# Patient Record
Sex: Female | Born: 1955
Health system: Southern US, Community
[De-identification: ages and names within clinical notes are randomized; demographics above are authoritative.]

## PROBLEM LIST (undated history)

## (undated) DIAGNOSIS — E785 Hyperlipidemia, unspecified: Secondary | ICD-10-CM

## (undated) DIAGNOSIS — E119 Type 2 diabetes mellitus without complications: Secondary | ICD-10-CM

## (undated) DIAGNOSIS — I10 Essential (primary) hypertension: Secondary | ICD-10-CM

## (undated) HISTORY — PX: BREAST SURGERY: SHX581

## (undated) HISTORY — PX: REDUCTION MAMMAPLASTY: SUR839

---

## 1998-08-26 ENCOUNTER — Other Ambulatory Visit: Admission: RE | Admit: 1998-08-26 | Discharge: 1998-08-26 | Payer: Self-pay | Admitting: Obstetrics and Gynecology

## 1998-09-02 ENCOUNTER — Other Ambulatory Visit: Admission: RE | Admit: 1998-09-02 | Discharge: 1998-09-02 | Payer: Self-pay | Admitting: Otolaryngology

## 2000-04-29 ENCOUNTER — Other Ambulatory Visit: Admission: RE | Admit: 2000-04-29 | Discharge: 2000-04-29 | Payer: Self-pay | Admitting: Obstetrics and Gynecology

## 2001-04-21 ENCOUNTER — Encounter: Admission: RE | Admit: 2001-04-21 | Discharge: 2001-04-21 | Payer: Self-pay | Admitting: Obstetrics and Gynecology

## 2001-04-21 ENCOUNTER — Encounter: Payer: Self-pay | Admitting: Obstetrics and Gynecology

## 2001-09-28 ENCOUNTER — Other Ambulatory Visit: Admission: RE | Admit: 2001-09-28 | Discharge: 2001-09-28 | Payer: Self-pay | Admitting: Obstetrics and Gynecology

## 2003-03-19 ENCOUNTER — Encounter: Admission: RE | Admit: 2003-03-19 | Discharge: 2003-03-19 | Payer: Self-pay | Admitting: Obstetrics and Gynecology

## 2003-04-23 ENCOUNTER — Other Ambulatory Visit: Admission: RE | Admit: 2003-04-23 | Discharge: 2003-04-23 | Payer: Self-pay | Admitting: Obstetrics and Gynecology

## 2003-06-05 ENCOUNTER — Encounter (INDEPENDENT_AMBULATORY_CARE_PROVIDER_SITE_OTHER): Payer: Self-pay | Admitting: Specialist

## 2003-06-05 ENCOUNTER — Ambulatory Visit (HOSPITAL_COMMUNITY): Admission: RE | Admit: 2003-06-05 | Discharge: 2003-06-05 | Payer: Self-pay | Admitting: Obstetrics and Gynecology

## 2004-02-10 HISTORY — PX: REDUCTION MAMMAPLASTY: SUR839

## 2004-05-16 ENCOUNTER — Encounter: Admission: RE | Admit: 2004-05-16 | Discharge: 2004-05-16 | Payer: Self-pay | Admitting: Obstetrics and Gynecology

## 2004-06-26 ENCOUNTER — Encounter (INDEPENDENT_AMBULATORY_CARE_PROVIDER_SITE_OTHER): Payer: Self-pay | Admitting: Specialist

## 2004-06-26 ENCOUNTER — Ambulatory Visit (HOSPITAL_COMMUNITY): Admission: RE | Admit: 2004-06-26 | Discharge: 2004-06-26 | Payer: Self-pay | Admitting: Gastroenterology

## 2004-07-22 ENCOUNTER — Other Ambulatory Visit: Admission: RE | Admit: 2004-07-22 | Discharge: 2004-07-22 | Payer: Self-pay | Admitting: Obstetrics and Gynecology

## 2006-03-30 ENCOUNTER — Encounter: Admission: RE | Admit: 2006-03-30 | Discharge: 2006-03-30 | Payer: Self-pay | Admitting: Obstetrics and Gynecology

## 2008-01-30 ENCOUNTER — Encounter: Admission: RE | Admit: 2008-01-30 | Discharge: 2008-01-30 | Payer: Self-pay | Admitting: Family Medicine

## 2008-02-20 ENCOUNTER — Encounter: Admission: RE | Admit: 2008-02-20 | Discharge: 2008-02-20 | Payer: Self-pay | Admitting: Family Medicine

## 2008-05-02 ENCOUNTER — Encounter: Admission: RE | Admit: 2008-05-02 | Discharge: 2008-05-02 | Payer: Self-pay | Admitting: Family Medicine

## 2009-05-21 ENCOUNTER — Encounter: Admission: RE | Admit: 2009-05-21 | Discharge: 2009-05-21 | Payer: Self-pay | Admitting: Family Medicine

## 2010-02-20 ENCOUNTER — Other Ambulatory Visit
Admission: RE | Admit: 2010-02-20 | Discharge: 2010-02-20 | Payer: Self-pay | Source: Home / Self Care | Admitting: Family Medicine

## 2010-03-02 ENCOUNTER — Encounter: Payer: Self-pay | Admitting: Family Medicine

## 2010-03-02 ENCOUNTER — Encounter: Payer: Self-pay | Admitting: Obstetrics and Gynecology

## 2010-06-27 NOTE — Op Note (Signed)
NAMEAVERIANNA, Diane Sherman                          ACCOUNT NO.:  [id number]   MEDICAL RECORD NO.:  [id number]                   PATIENT TYPE:  AMB   LOCATION:  SDC                                  FACILITY:  [facility]   PHYSICIAN:  Juluis Mire, M.D.                DATE OF BIRTH:  19-May-1955   DATE OF PROCEDURE:  06/05/2003  DATE OF DISCHARGE:                                 OPERATIVE REPORT   PREOPERATIVE DIAGNOSES:  1. Anovulatory bleeding.  2. Endometrial polyp.   POSTOPERATIVE DIAGNOSES:  1. Anovulatory bleeding.  2. Endometrial polyp.   PROCEDURE:  Paracervical block.  Cervical dilation.  Hysteroscopy resection  of submucosal fibroid.  Multiple endometrial biopsies.   SURGEON:  Juluis Mire, M.D.   ANESTHESIA:  Paracervical block and sedation.   ESTIMATED BLOOD LOSS:  Minimal.   PACKS AND DRAINS:  None.   INTRAOPERATIVE BLOOD PLACED:  None.   COMPLICATIONS:  None.   INDICATIONS FOR PROCEDURE:  As noted in history and physical.   DESCRIPTION OF PROCEDURE:  The patient taken to the OR and placed in the  supine position.  After sedation, was placed in the dorsal lithotomy  position using Allen stirrups.  The patient was draped out for hysteroscopy.  Speculum was placed in the vaginal vault.  The cervix and vagina closed with  Betadine.  A paracervical block was instituted using 1% Nesacaine.  Cervix  grasped with single-tooth tenaculum.  Uterus sounded to 9 cm.  Cervix  serially dilated to a size 37 Pratt dilator.  Operative hysteroscope was  then introduced.  Intrauterine cavity was distended using Sorbitol.  Visualization revealed a submucosal fibroid on the right posterior aspect of  the uterus.  This was completely resected and sent for pathological review.  Multiple endometrial biopsies were obtained.  Endometrium was otherwise thin  and atrophic.  There was no other evidence of abnormalities.  She had no  active bleeding or signs of perforation.  At this point in  time, the  hysteroscope was removed.  Single-tooth tenaculum and  speculum were then removed.  The patient taken out of the dorsal lithotomy  position and once alert, transferred to the recovery room in good condition.  Sponge, needle and instrument counts were reported as correct by the  circulating nurse.                                               Juluis Mire, M.D.    JSM/MEDQ  D:  06/05/2003  T:  06/05/2003  Job:  [id number]

## 2011-09-21 ENCOUNTER — Other Ambulatory Visit: Payer: Self-pay | Admitting: Family Medicine

## 2011-09-21 DIAGNOSIS — Z1231 Encounter for screening mammogram for malignant neoplasm of breast: Secondary | ICD-10-CM

## 2011-10-15 ENCOUNTER — Ambulatory Visit
Admission: RE | Admit: 2011-10-15 | Discharge: 2011-10-15 | Disposition: A | Discharge status: Home / Self Care | Payer: 59 | Source: Ambulatory Visit | Attending: Family Medicine | Admitting: Family Medicine

## 2011-10-15 ENCOUNTER — Ambulatory Visit: Payer: Self-pay

## 2011-10-15 DIAGNOSIS — Z1231 Encounter for screening mammogram for malignant neoplasm of breast: Secondary | ICD-10-CM

## 2013-05-04 ENCOUNTER — Other Ambulatory Visit: Payer: Self-pay

## 2013-05-04 ENCOUNTER — Other Ambulatory Visit (HOSPITAL_COMMUNITY)
Admission: RE | Admit: 2013-05-04 | Discharge: 2013-05-04 | Disposition: A | Discharge status: Home / Self Care | Payer: 59 | Source: Ambulatory Visit | Attending: Family Medicine | Admitting: Family Medicine

## 2013-05-04 ENCOUNTER — Other Ambulatory Visit: Payer: Self-pay | Admitting: Family Medicine

## 2013-05-04 DIAGNOSIS — Z0000 Encounter for general adult medical examination without abnormal findings: Secondary | ICD-10-CM | POA: Insufficient documentation

## 2013-05-04 DIAGNOSIS — Z1231 Encounter for screening mammogram for malignant neoplasm of breast: Secondary | ICD-10-CM

## 2013-05-04 DIAGNOSIS — Z9889 Other specified postprocedural states: Secondary | ICD-10-CM

## 2013-05-12 ENCOUNTER — Ambulatory Visit: Payer: 59

## 2013-05-15 ENCOUNTER — Ambulatory Visit
Admission: RE | Admit: 2013-05-15 | Discharge: 2013-05-15 | Disposition: A | Discharge status: Home / Self Care | Payer: 59 | Source: Ambulatory Visit

## 2013-05-15 DIAGNOSIS — Z1231 Encounter for screening mammogram for malignant neoplasm of breast: Secondary | ICD-10-CM

## 2013-05-15 DIAGNOSIS — Z9889 Other specified postprocedural states: Secondary | ICD-10-CM

## 2015-06-23 DIAGNOSIS — J019 Acute sinusitis, unspecified: Secondary | ICD-10-CM | POA: Diagnosis not present

## 2015-07-15 DIAGNOSIS — K219 Gastro-esophageal reflux disease without esophagitis: Secondary | ICD-10-CM | POA: Diagnosis not present

## 2015-07-15 DIAGNOSIS — E119 Type 2 diabetes mellitus without complications: Secondary | ICD-10-CM | POA: Diagnosis not present

## 2015-07-15 DIAGNOSIS — K760 Fatty (change of) liver, not elsewhere classified: Secondary | ICD-10-CM | POA: Diagnosis not present

## 2015-07-15 DIAGNOSIS — E1165 Type 2 diabetes mellitus with hyperglycemia: Secondary | ICD-10-CM | POA: Diagnosis not present

## 2015-07-15 DIAGNOSIS — I10 Essential (primary) hypertension: Secondary | ICD-10-CM | POA: Diagnosis not present

## 2015-07-15 DIAGNOSIS — Z79899 Other long term (current) drug therapy: Secondary | ICD-10-CM | POA: Diagnosis not present

## 2015-07-15 DIAGNOSIS — E785 Hyperlipidemia, unspecified: Secondary | ICD-10-CM | POA: Diagnosis not present

## 2015-08-22 DIAGNOSIS — R0781 Pleurodynia: Secondary | ICD-10-CM | POA: Diagnosis not present

## 2015-08-22 DIAGNOSIS — R109 Unspecified abdominal pain: Secondary | ICD-10-CM | POA: Diagnosis not present

## 2015-08-22 DIAGNOSIS — M79605 Pain in left leg: Secondary | ICD-10-CM | POA: Diagnosis not present

## 2015-09-20 DIAGNOSIS — E119 Type 2 diabetes mellitus without complications: Secondary | ICD-10-CM | POA: Diagnosis not present

## 2015-09-20 DIAGNOSIS — Z79891 Long term (current) use of opiate analgesic: Secondary | ICD-10-CM | POA: Diagnosis not present

## 2015-09-20 DIAGNOSIS — Z79899 Other long term (current) drug therapy: Secondary | ICD-10-CM | POA: Diagnosis not present

## 2015-09-20 DIAGNOSIS — Z7982 Long term (current) use of aspirin: Secondary | ICD-10-CM | POA: Diagnosis not present

## 2015-09-20 DIAGNOSIS — G4730 Sleep apnea, unspecified: Secondary | ICD-10-CM | POA: Diagnosis not present

## 2015-09-20 DIAGNOSIS — Z86010 Personal history of colonic polyps: Secondary | ICD-10-CM | POA: Diagnosis not present

## 2015-09-20 DIAGNOSIS — Z7984 Long term (current) use of oral hypoglycemic drugs: Secondary | ICD-10-CM | POA: Diagnosis not present

## 2015-09-20 DIAGNOSIS — Z8719 Personal history of other diseases of the digestive system: Secondary | ICD-10-CM | POA: Diagnosis not present

## 2015-09-20 DIAGNOSIS — Z1211 Encounter for screening for malignant neoplasm of colon: Secondary | ICD-10-CM | POA: Diagnosis not present

## 2015-09-20 DIAGNOSIS — D120 Benign neoplasm of cecum: Secondary | ICD-10-CM | POA: Diagnosis not present

## 2015-09-20 DIAGNOSIS — I10 Essential (primary) hypertension: Secondary | ICD-10-CM | POA: Diagnosis not present

## 2015-09-20 DIAGNOSIS — Z7951 Long term (current) use of inhaled steroids: Secondary | ICD-10-CM | POA: Diagnosis not present

## 2015-11-18 DIAGNOSIS — E785 Hyperlipidemia, unspecified: Secondary | ICD-10-CM | POA: Diagnosis not present

## 2015-11-18 DIAGNOSIS — Z23 Encounter for immunization: Secondary | ICD-10-CM | POA: Diagnosis not present

## 2015-11-18 DIAGNOSIS — E119 Type 2 diabetes mellitus without complications: Secondary | ICD-10-CM | POA: Diagnosis not present

## 2015-11-18 DIAGNOSIS — I10 Essential (primary) hypertension: Secondary | ICD-10-CM | POA: Diagnosis not present

## 2016-02-18 DIAGNOSIS — E785 Hyperlipidemia, unspecified: Secondary | ICD-10-CM | POA: Diagnosis not present

## 2016-02-18 DIAGNOSIS — I10 Essential (primary) hypertension: Secondary | ICD-10-CM | POA: Diagnosis not present

## 2016-02-18 DIAGNOSIS — E1165 Type 2 diabetes mellitus with hyperglycemia: Secondary | ICD-10-CM | POA: Diagnosis not present

## 2016-02-18 DIAGNOSIS — E119 Type 2 diabetes mellitus without complications: Secondary | ICD-10-CM | POA: Diagnosis not present

## 2016-03-04 DIAGNOSIS — H524 Presbyopia: Secondary | ICD-10-CM | POA: Diagnosis not present

## 2016-03-04 DIAGNOSIS — E119 Type 2 diabetes mellitus without complications: Secondary | ICD-10-CM | POA: Diagnosis not present

## 2016-04-07 ENCOUNTER — Encounter: Payer: BLUE CROSS/BLUE SHIELD | Attending: Family Medicine | Admitting: Registered"

## 2016-04-07 DIAGNOSIS — E119 Type 2 diabetes mellitus without complications: Secondary | ICD-10-CM

## 2016-04-07 DIAGNOSIS — Z713 Dietary counseling and surveillance: Secondary | ICD-10-CM | POA: Diagnosis not present

## 2016-04-07 NOTE — Patient Instructions (Signed)
Plan:  Aim for 2-3 Carb Choices per meal (30-45 grams)   Aim for 0-2 Carbs per snack if hungry  Consider veggies and yogurt ranch dip for another snack options Include protein in moderation with your meals and snacks Consider reading food labels for Total Carbohydrate and Sat Fat Grams of foods Consider increasing your activity level by 10-15 minutes daily and work up to 30 min with weekend Zumba as tolerated Continue checking BG at alternate times per day as directed by MD  Consider taking medication as directed by MD

## 2016-04-07 NOTE — Progress Notes (Signed)
Diabetes Self-Management Education  Visit Type: First/Initial  Appt. Start Time: 1600 Appt. End Time: 1715  04/07/2016  Ms. Diane Sherman, identified by name and date of birth, is a 61 y.o. female with a diagnosis of Diabetes: Type 2.   ASSESSMENT      Diabetes Self-Management Education - 04/07/16 1609      Visit Information   Visit Type First/Initial     Initial Visit   Diabetes Type Type 2   Are you currently following a meal plan? No   Are you taking your medications as prescribed? Yes   Date Diagnosed more than 10 yrs ago     Health Coping   How would you rate your overall health? Good     Psychosocial Assessment   Patient Belief/Attitude about Diabetes Defeat/Burnout   How often do you need to have someone help you when you read instructions, pamphlets, or other written materials from your doctor or pharmacy? 1 - Never   What is the last grade level you completed in school? college degree     Complications   Last HgB A1C per patient/outside source 8 %  Jan 2018   How often do you check your blood sugar? --  1-2 times / week   Fasting Blood glucose range (mg/dL) 161-096;045-409  morning - 160-190   Number of hypoglycemic episodes per month 0   Have you had a dilated eye exam in the past 12 months? Yes   Have you had a dental exam in the past 12 months? Yes   Are you checking your feet? Yes   How many days per week are you checking your feet? 6     Dietary Intake   Breakfast bowl of cereal shredded choc bites OR english muffin w PB OR pastry, soy slender   Snack (morning) beet crackers OR cheesiit OR popcorn, chex mix OR fruit  10:30   Lunch 6" sub OR apple pecan salad at [facility], crackers, 1/2 sweet/unsweet tea   Snack (afternoon) none   Dinner goes out [employer] chicken w sauteed veggies OR fish, salad, rice or potato, sweet tea   Snack (evening) 3 times week (at most) ice cream sandwich or frozen banana popsicle   Beverage(s) 1% milk, sip on sweet  tea at work,energy drink zip fiz 20 oz water     Exercise   Exercise Type Light (walking / raking leaves);ADL's   How many days per week to you exercise? 2   How many minutes per day do you exercise? 15   Total minutes per week of exercise 30     Patient Education   Previous Diabetes Education Yes (please comment)  8-10 yrs ago attended class   Disease state  Definition of diabetes, type 1 and 2, and the diagnosis of diabetes;Factors that contribute to the development of diabetes   Nutrition management  Role of diet in the treatment of diabetes and the relationship between the three main macronutrients and blood glucose level;Food label reading, portion sizes and measuring food.;Carbohydrate counting;Reviewed blood glucose goals for pre and post meals and how to evaluate the patients' food intake on their blood glucose level.   Physical activity and exercise  Role of exercise on diabetes management, blood pressure control and cardiac health.   Medications Reviewed patients medication for diabetes, action, purpose, timing of dose and side effects.   Monitoring Identified appropriate SMBG and/or A1C goals.     Individualized Goals (developed by patient)   Nutrition General guidelines  for healthy choices and portions discussed   Physical Activity 30 minutes per day     Outcomes   Expected Outcomes Demonstrated interest in learning. Expect positive outcomes   Future DMSE PRN   Program Status Completed      Individualized Plan for Diabetes Self-Management Training:   Learning Objective:  Patient will have a greater understanding of diabetes self-management. Patient education plan is to attend individual and/or group sessions per assessed needs and concerns.    Patient Instructions  Plan:  Aim for 2-3 Carb Choices per meal (30-45 grams)   Aim for 0-2 Carbs per snack if hungry  Consider veggies and yogurt ranch dip for another snack options Include protein in moderation with your  meals and snacks Consider reading food labels for Total Carbohydrate and Sat Fat Grams of foods Consider increasing your activity level by 10-15 minutes daily and work up to 30 min with weekend Zumba as tolerated Continue checking BG at alternate times per day as directed by MD  Consider taking medication as directed by MD     Expected Outcomes:  Demonstrated interest in learning. Expect positive outcomes  Education material provided: Living Well with Diabetes, Food label handouts, A1C conversion sheet, Meal plan card, My Plate, Snack sheet and Support group flyer  If problems or questions, patient to contact team via:  Phone and Email  Future DSME appointment: PRN

## 2016-05-21 DIAGNOSIS — R3915 Urgency of urination: Secondary | ICD-10-CM | POA: Diagnosis not present

## 2016-05-21 DIAGNOSIS — E1140 Type 2 diabetes mellitus with diabetic neuropathy, unspecified: Secondary | ICD-10-CM | POA: Diagnosis not present

## 2016-05-21 DIAGNOSIS — I10 Essential (primary) hypertension: Secondary | ICD-10-CM | POA: Diagnosis not present

## 2016-05-21 DIAGNOSIS — E785 Hyperlipidemia, unspecified: Secondary | ICD-10-CM | POA: Diagnosis not present

## 2016-07-09 DIAGNOSIS — M26609 Unspecified temporomandibular joint disorder, unspecified side: Secondary | ICD-10-CM | POA: Diagnosis not present

## 2016-08-20 DIAGNOSIS — E1165 Type 2 diabetes mellitus with hyperglycemia: Secondary | ICD-10-CM | POA: Diagnosis not present

## 2016-08-20 DIAGNOSIS — E1140 Type 2 diabetes mellitus with diabetic neuropathy, unspecified: Secondary | ICD-10-CM | POA: Diagnosis not present

## 2016-08-20 DIAGNOSIS — E785 Hyperlipidemia, unspecified: Secondary | ICD-10-CM | POA: Diagnosis not present

## 2016-08-20 DIAGNOSIS — I10 Essential (primary) hypertension: Secondary | ICD-10-CM | POA: Diagnosis not present

## 2016-10-04 ENCOUNTER — Emergency Department (HOSPITAL_BASED_OUTPATIENT_CLINIC_OR_DEPARTMENT_OTHER)
Admission: EM | Admit: 2016-10-04 | Discharge: 2016-10-04 | Disposition: A | Discharge status: Home / Self Care | Payer: BLUE CROSS/BLUE SHIELD | Attending: Emergency Medicine | Admitting: Emergency Medicine

## 2016-10-04 ENCOUNTER — Encounter (HOSPITAL_BASED_OUTPATIENT_CLINIC_OR_DEPARTMENT_OTHER): Payer: Self-pay | Admitting: Emergency Medicine

## 2016-10-04 ENCOUNTER — Emergency Department (HOSPITAL_BASED_OUTPATIENT_CLINIC_OR_DEPARTMENT_OTHER): Payer: BLUE CROSS/BLUE SHIELD

## 2016-10-04 DIAGNOSIS — Z79899 Other long term (current) drug therapy: Secondary | ICD-10-CM | POA: Insufficient documentation

## 2016-10-04 DIAGNOSIS — Z7984 Long term (current) use of oral hypoglycemic drugs: Secondary | ICD-10-CM | POA: Insufficient documentation

## 2016-10-04 DIAGNOSIS — Y999 Unspecified external cause status: Secondary | ICD-10-CM | POA: Insufficient documentation

## 2016-10-04 DIAGNOSIS — S92355A Nondisplaced fracture of fifth metatarsal bone, left foot, initial encounter for closed fracture: Secondary | ICD-10-CM | POA: Diagnosis not present

## 2016-10-04 DIAGNOSIS — S92352A Displaced fracture of fifth metatarsal bone, left foot, initial encounter for closed fracture: Secondary | ICD-10-CM

## 2016-10-04 DIAGNOSIS — I10 Essential (primary) hypertension: Secondary | ICD-10-CM | POA: Diagnosis not present

## 2016-10-04 DIAGNOSIS — E119 Type 2 diabetes mellitus without complications: Secondary | ICD-10-CM | POA: Insufficient documentation

## 2016-10-04 DIAGNOSIS — W0110XA Fall on same level from slipping, tripping and stumbling with subsequent striking against unspecified object, initial encounter: Secondary | ICD-10-CM | POA: Insufficient documentation

## 2016-10-04 DIAGNOSIS — Z7982 Long term (current) use of aspirin: Secondary | ICD-10-CM | POA: Insufficient documentation

## 2016-10-04 DIAGNOSIS — Y929 Unspecified place or not applicable: Secondary | ICD-10-CM | POA: Insufficient documentation

## 2016-10-04 DIAGNOSIS — Y9301 Activity, walking, marching and hiking: Secondary | ICD-10-CM | POA: Diagnosis not present

## 2016-10-04 DIAGNOSIS — S99922A Unspecified injury of left foot, initial encounter: Secondary | ICD-10-CM | POA: Diagnosis not present

## 2016-10-04 HISTORY — DX: Type 2 diabetes mellitus without complications: E11.9

## 2016-10-04 HISTORY — DX: Hyperlipidemia, unspecified: E78.5

## 2016-10-04 HISTORY — DX: Essential (primary) hypertension: I10

## 2016-10-04 MED ORDER — HYDROCODONE-ACETAMINOPHEN 5-325 MG PO TABS: 1.0000 | ORAL_TABLET | Freq: Four times a day (QID) | ORAL | 0 refills | Status: AC | PRN

## 2016-10-04 MED ORDER — IBUPROFEN 800 MG PO TABS
800.0000 mg | ORAL_TABLET | Freq: Once | ORAL | Status: AC
  Administered 2016-10-04: 800 mg via ORAL
  Filled 2016-10-04: qty 1

## 2016-10-04 NOTE — ED Triage Notes (Signed)
Pt c/o LT ankle pain s/p twisting it while wearing wedge heels today.

## 2016-10-04 NOTE — Discharge Instructions (Signed)
Return here as needed. Follow up with the orthopedist provided.  °

## 2016-10-05 NOTE — ED Provider Notes (Signed)
[facility]-EMERGENCY DEPT Provider Note   CSN: [id number] Arrival date & time: 10/04/16  1246     History   Chief Complaint Chief Complaint  Patient presents with  . Ankle Pain    HPI Diane Sherman is a 61 y.o. female.  HPI Patient presents to the emergency department with left ankle pain following a twisting injury and fall.  The patient states that she is local parking lot with high heels on when she twisted her ankle.  Patient states that she has pain in the ankle and foot, but mostly in the lateral foot.  Patient states she does not have any other injuries.  Patient denies any numbness or weakness in the foot Past Medical History:  Diagnosis Date  . Diabetes mellitus without complication (HCC)   . Hyperlipidemia   . Hypertension     There are no active problems to display for this patient.   Past Surgical History:  Procedure Laterality Date  . BREAST SURGERY     breast reduction  . CESAREAN SECTION      OB History    No data available       Home Medications    Prior to Admission medications   Medication Sig Start Date End Date Taking? Authorizing Provider  aspirin EC 81 MG tablet Take 81 mg by mouth daily.    [provider]  Dulaglutide (TRULICITY) 1.5 MG/0.5ML SOPN Inject into the skin.    [provider]  empagliflozin (JARDIANCE) 25 MG TABS tablet Take 25 mg by mouth daily.    [provider]  fexofenadine (ALLEGRA) 180 MG tablet Take 180 mg by mouth daily.    [provider]  fluticasone (FLONASE) 50 MCG/ACT nasal spray Place into both nostrils daily.    [provider]  gabapentin (NEURONTIN) 100 MG capsule Take 100 mg by mouth 3 (three) times daily.    [provider]  HYDROcodone-acetaminophen (NORCO/VICODIN) 5-325 MG tablet Take 1 tablet by mouth every 6 (six) hours as needed for moderate pain. 10/04/16   Kindall Swaby, Cristal Deer, PA-C  lisinopril (PRINIVIL,ZESTRIL) 20 MG tablet Take 20 mg by mouth daily.     [provider]  metFORMIN (GLUCOPHAGE) 500 MG tablet Take 1,000 mg by mouth 2 (two) times daily with a meal.    [provider]  omeprazole (PRILOSEC) 40 MG capsule Take 40 mg by mouth daily.    [provider]  simvastatin (ZOCOR) 20 MG tablet Take 20 mg by mouth daily.    [provider]    Family History History reviewed. No pertinent family history.  Social History Social History  Substance Use Topics  . Smoking status: Never Smoker  . Smokeless tobacco: Never Used  . Alcohol use No     Allergies   Patient has no known allergies.   Review of Systems Review of Systems All other systems negative except as documented in the HPI. All pertinent positives and negatives as reviewed in the HPI.  Physical Exam Updated Vital Signs BP (!) 151/88   Pulse 86   Temp 98.3 F (36.8 C) (Oral)   Resp 16   Ht 5\' 5"  (1.651 m)   Wt 117.9 kg (260 lb)   SpO2 97%   BMI 43.27 kg/m   Physical Exam  Constitutional: She is oriented to person, place, and time. She appears well-developed and well-nourished. No distress.  HENT:  Head: Normocephalic and atraumatic.  Eyes: Pupils are equal, round, and reactive to light.  Pulmonary/Chest: Effort normal.  Musculoskeletal:       Left foot: There is decreased range of motion, tenderness, bony tenderness and swelling. There is no crepitus and no deformity.       Feet:  Neurological: She is alert and oriented to person, place, and time.  Skin: Skin is warm and dry.  Psychiatric: She has a normal mood and affect.  Nursing note and vitals reviewed.    ED Treatments / Results  Labs (all labs ordered are listed, but only abnormal results are displayed) Labs Reviewed - No data to display  EKG  EKG Interpretation None       Radiology Dg Ankle Complete Left  Result Date: 10/04/2016 CLINICAL DATA:  Twisting injury with lateral foot and ankle pain, initial encounter EXAM: LEFT ANKLE COMPLETE - 3+ VIEW  COMPARISON:  None. FINDINGS: There is a mildly displaced fracture through the base of the fifth metatarsal. Os naviculare is noted. Calcaneal spurring is seen. No other focal abnormality is noted. IMPRESSION: Mildly displaced fracture through the base of the fifth metatarsal. Electronically Signed   By: Alcide Clever M.D.   On: 10/04/2016 13:50    Procedures Procedures (including critical care time)  Medications Ordered in ED Medications  ibuprofen (ADVIL,MOTRIN) tablet 800 mg (800 mg Oral Given 10/04/16 1451)     Initial Impression / Assessment and Plan / ED Course  I have reviewed the triage vital signs and the nursing notes.  Pertinent labs & imaging results that were available during my care of the patient were reviewed by me and considered in my medical decision making (see chart for details).     Patient be placed in a posterior splint and crutches.  Referred to orthopedics.  Told to return here as needed.  Patient is advised ice and elevate the ankle and foot  Final Clinical Impressions(s) / ED Diagnoses   Final diagnoses:  Closed fracture of fifth metatarsal bone of left foot, physeal involvement unspecified, initial encounter    New Prescriptions Discharge Medication List as of 10/04/2016  3:11 PM    START taking these medications   Details  HYDROcodone-acetaminophen (NORCO/VICODIN) 5-325 MG tablet Take 1 tablet by mouth every 6 (six) hours as needed for moderate pain., Starting Sun 10/04/2016, Print         Kyrese Gartman, Martin, PA-C 10/05/16 1649    Alvira Monday, MD 10/13/16 [phone]

## 2016-10-07 DIAGNOSIS — S92302A Fracture of unspecified metatarsal bone(s), left foot, initial encounter for closed fracture: Secondary | ICD-10-CM | POA: Diagnosis not present

## 2016-10-23 DIAGNOSIS — S92302A Fracture of unspecified metatarsal bone(s), left foot, initial encounter for closed fracture: Secondary | ICD-10-CM | POA: Diagnosis not present

## 2016-10-26 DIAGNOSIS — S92302D Fracture of unspecified metatarsal bone(s), left foot, subsequent encounter for fracture with routine healing: Secondary | ICD-10-CM | POA: Diagnosis not present

## 2016-11-19 DIAGNOSIS — S92302D Fracture of unspecified metatarsal bone(s), left foot, subsequent encounter for fracture with routine healing: Secondary | ICD-10-CM | POA: Diagnosis not present

## 2016-11-27 DIAGNOSIS — Z23 Encounter for immunization: Secondary | ICD-10-CM | POA: Diagnosis not present

## 2016-11-27 DIAGNOSIS — E785 Hyperlipidemia, unspecified: Secondary | ICD-10-CM | POA: Diagnosis not present

## 2016-11-27 DIAGNOSIS — E1140 Type 2 diabetes mellitus with diabetic neuropathy, unspecified: Secondary | ICD-10-CM | POA: Diagnosis not present

## 2016-11-27 DIAGNOSIS — E1165 Type 2 diabetes mellitus with hyperglycemia: Secondary | ICD-10-CM | POA: Diagnosis not present

## 2016-11-27 DIAGNOSIS — I10 Essential (primary) hypertension: Secondary | ICD-10-CM | POA: Diagnosis not present

## 2016-12-17 DIAGNOSIS — S92352D Displaced fracture of fifth metatarsal bone, left foot, subsequent encounter for fracture with routine healing: Secondary | ICD-10-CM | POA: Diagnosis not present

## 2016-12-29 DIAGNOSIS — S92302D Fracture of unspecified metatarsal bone(s), left foot, subsequent encounter for fracture with routine healing: Secondary | ICD-10-CM | POA: Diagnosis not present

## 2016-12-29 DIAGNOSIS — S93302D Unspecified subluxation of left foot, subsequent encounter: Secondary | ICD-10-CM | POA: Diagnosis not present

## 2017-01-05 DIAGNOSIS — S92302D Fracture of unspecified metatarsal bone(s), left foot, subsequent encounter for fracture with routine healing: Secondary | ICD-10-CM | POA: Diagnosis not present

## 2017-01-07 DIAGNOSIS — S92302D Fracture of unspecified metatarsal bone(s), left foot, subsequent encounter for fracture with routine healing: Secondary | ICD-10-CM | POA: Diagnosis not present

## 2017-01-14 DIAGNOSIS — S92032D Displaced avulsion fracture of tuberosity of left calcaneus, subsequent encounter for fracture with routine healing: Secondary | ICD-10-CM | POA: Diagnosis not present

## 2017-01-14 DIAGNOSIS — S92302D Fracture of unspecified metatarsal bone(s), left foot, subsequent encounter for fracture with routine healing: Secondary | ICD-10-CM | POA: Diagnosis not present

## 2017-01-26 DIAGNOSIS — S92352D Displaced fracture of fifth metatarsal bone, left foot, subsequent encounter for fracture with routine healing: Secondary | ICD-10-CM | POA: Diagnosis not present

## 2017-03-09 DIAGNOSIS — H524 Presbyopia: Secondary | ICD-10-CM | POA: Diagnosis not present

## 2017-03-09 DIAGNOSIS — H52203 Unspecified astigmatism, bilateral: Secondary | ICD-10-CM | POA: Diagnosis not present

## 2017-03-09 DIAGNOSIS — E119 Type 2 diabetes mellitus without complications: Secondary | ICD-10-CM | POA: Diagnosis not present

## 2017-03-24 DIAGNOSIS — J019 Acute sinusitis, unspecified: Secondary | ICD-10-CM | POA: Diagnosis not present

## 2017-05-12 DIAGNOSIS — G4733 Obstructive sleep apnea (adult) (pediatric): Secondary | ICD-10-CM | POA: Diagnosis not present

## 2017-05-21 DIAGNOSIS — G4733 Obstructive sleep apnea (adult) (pediatric): Secondary | ICD-10-CM | POA: Diagnosis not present

## 2017-06-07 DIAGNOSIS — I10 Essential (primary) hypertension: Secondary | ICD-10-CM | POA: Diagnosis not present

## 2017-06-07 DIAGNOSIS — E1140 Type 2 diabetes mellitus with diabetic neuropathy, unspecified: Secondary | ICD-10-CM | POA: Diagnosis not present

## 2017-06-07 DIAGNOSIS — K219 Gastro-esophageal reflux disease without esophagitis: Secondary | ICD-10-CM | POA: Diagnosis not present

## 2017-06-07 DIAGNOSIS — E785 Hyperlipidemia, unspecified: Secondary | ICD-10-CM | POA: Diagnosis not present

## 2017-06-10 ENCOUNTER — Other Ambulatory Visit: Payer: Self-pay | Admitting: Family Medicine

## 2017-06-10 DIAGNOSIS — Z1231 Encounter for screening mammogram for malignant neoplasm of breast: Secondary | ICD-10-CM

## 2017-06-15 DIAGNOSIS — E119 Type 2 diabetes mellitus without complications: Secondary | ICD-10-CM | POA: Diagnosis not present

## 2017-07-12 DIAGNOSIS — E119 Type 2 diabetes mellitus without complications: Secondary | ICD-10-CM | POA: Diagnosis not present

## 2017-07-12 DIAGNOSIS — Z6841 Body Mass Index (BMI) 40.0 and over, adult: Secondary | ICD-10-CM | POA: Diagnosis not present

## 2017-07-20 ENCOUNTER — Ambulatory Visit
Admission: RE | Admit: 2017-07-20 | Discharge: 2017-07-20 | Disposition: A | Discharge status: Home / Self Care | Payer: BLUE CROSS/BLUE SHIELD | Source: Ambulatory Visit | Attending: Family Medicine | Admitting: Family Medicine

## 2017-07-20 DIAGNOSIS — Z1231 Encounter for screening mammogram for malignant neoplasm of breast: Secondary | ICD-10-CM

## 2017-08-17 DIAGNOSIS — E119 Type 2 diabetes mellitus without complications: Secondary | ICD-10-CM | POA: Diagnosis not present

## 2017-08-17 DIAGNOSIS — Z6841 Body Mass Index (BMI) 40.0 and over, adult: Secondary | ICD-10-CM | POA: Diagnosis not present

## 2017-10-07 DIAGNOSIS — L239 Allergic contact dermatitis, unspecified cause: Secondary | ICD-10-CM | POA: Diagnosis not present

## 2017-11-10 DIAGNOSIS — E119 Type 2 diabetes mellitus without complications: Secondary | ICD-10-CM | POA: Diagnosis not present

## 2017-11-17 DIAGNOSIS — Z6841 Body Mass Index (BMI) 40.0 and over, adult: Secondary | ICD-10-CM | POA: Diagnosis not present

## 2017-11-17 DIAGNOSIS — J0110 Acute frontal sinusitis, unspecified: Secondary | ICD-10-CM | POA: Diagnosis not present

## 2017-11-17 DIAGNOSIS — E119 Type 2 diabetes mellitus without complications: Secondary | ICD-10-CM | POA: Diagnosis not present

## 2018-01-19 ENCOUNTER — Other Ambulatory Visit (HOSPITAL_COMMUNITY)
Admission: RE | Admit: 2018-01-19 | Discharge: 2018-01-19 | Disposition: A | Discharge status: Home / Self Care | Payer: BLUE CROSS/BLUE SHIELD | Source: Ambulatory Visit | Attending: Family Medicine | Admitting: Family Medicine

## 2018-01-19 ENCOUNTER — Other Ambulatory Visit: Payer: Self-pay | Admitting: Family Medicine

## 2018-01-19 DIAGNOSIS — E2839 Other primary ovarian failure: Secondary | ICD-10-CM

## 2018-01-19 DIAGNOSIS — E785 Hyperlipidemia, unspecified: Secondary | ICD-10-CM | POA: Diagnosis not present

## 2018-01-19 DIAGNOSIS — Z23 Encounter for immunization: Secondary | ICD-10-CM | POA: Diagnosis not present

## 2018-01-19 DIAGNOSIS — Z124 Encounter for screening for malignant neoplasm of cervix: Secondary | ICD-10-CM | POA: Insufficient documentation

## 2018-01-19 DIAGNOSIS — E1140 Type 2 diabetes mellitus with diabetic neuropathy, unspecified: Secondary | ICD-10-CM | POA: Diagnosis not present

## 2018-01-19 DIAGNOSIS — Z0000 Encounter for general adult medical examination without abnormal findings: Secondary | ICD-10-CM | POA: Diagnosis not present

## 2018-01-19 DIAGNOSIS — I10 Essential (primary) hypertension: Secondary | ICD-10-CM | POA: Diagnosis not present

## 2018-01-19 DIAGNOSIS — D649 Anemia, unspecified: Secondary | ICD-10-CM | POA: Diagnosis not present

## 2018-01-24 LAB — CYTOLOGY - PAP: Diagnosis: NEGATIVE

## 2018-02-22 DIAGNOSIS — G4733 Obstructive sleep apnea (adult) (pediatric): Secondary | ICD-10-CM | POA: Diagnosis not present

## 2018-03-29 DIAGNOSIS — Z23 Encounter for immunization: Secondary | ICD-10-CM | POA: Diagnosis not present

## 2018-04-14 ENCOUNTER — Other Ambulatory Visit: Payer: BLUE CROSS/BLUE SHIELD

## 2018-04-18 ENCOUNTER — Ambulatory Visit
Admission: RE | Admit: 2018-04-18 | Discharge: 2018-04-18 | Disposition: A | Discharge status: Home / Self Care | Payer: BLUE CROSS/BLUE SHIELD | Source: Ambulatory Visit | Attending: Family Medicine | Admitting: Family Medicine

## 2018-04-18 DIAGNOSIS — M85852 Other specified disorders of bone density and structure, left thigh: Secondary | ICD-10-CM | POA: Diagnosis not present

## 2018-04-18 DIAGNOSIS — Z780 Asymptomatic menopausal state: Secondary | ICD-10-CM | POA: Diagnosis not present

## 2018-04-18 DIAGNOSIS — E2839 Other primary ovarian failure: Secondary | ICD-10-CM

## 2018-05-11 DIAGNOSIS — E559 Vitamin D deficiency, unspecified: Secondary | ICD-10-CM | POA: Diagnosis not present

## 2018-05-11 DIAGNOSIS — E119 Type 2 diabetes mellitus without complications: Secondary | ICD-10-CM | POA: Diagnosis not present

## 2018-05-11 DIAGNOSIS — Z79899 Other long term (current) drug therapy: Secondary | ICD-10-CM | POA: Diagnosis not present

## 2018-05-18 DIAGNOSIS — E119 Type 2 diabetes mellitus without complications: Secondary | ICD-10-CM | POA: Diagnosis not present

## 2018-05-18 DIAGNOSIS — Z6841 Body Mass Index (BMI) 40.0 and over, adult: Secondary | ICD-10-CM | POA: Diagnosis not present

## 2018-06-01 DIAGNOSIS — G4733 Obstructive sleep apnea (adult) (pediatric): Secondary | ICD-10-CM | POA: Diagnosis not present

## 2018-07-26 DIAGNOSIS — G4733 Obstructive sleep apnea (adult) (pediatric): Secondary | ICD-10-CM | POA: Diagnosis not present

## 2018-09-26 DIAGNOSIS — E1140 Type 2 diabetes mellitus with diabetic neuropathy, unspecified: Secondary | ICD-10-CM | POA: Diagnosis not present

## 2018-09-26 DIAGNOSIS — K5900 Constipation, unspecified: Secondary | ICD-10-CM | POA: Diagnosis not present

## 2018-09-26 DIAGNOSIS — I10 Essential (primary) hypertension: Secondary | ICD-10-CM | POA: Diagnosis not present

## 2018-09-26 DIAGNOSIS — E785 Hyperlipidemia, unspecified: Secondary | ICD-10-CM | POA: Diagnosis not present

## 2018-11-10 DIAGNOSIS — E119 Type 2 diabetes mellitus without complications: Secondary | ICD-10-CM | POA: Diagnosis not present

## 2018-11-11 DIAGNOSIS — E785 Hyperlipidemia, unspecified: Secondary | ICD-10-CM | POA: Diagnosis not present

## 2018-11-11 DIAGNOSIS — I10 Essential (primary) hypertension: Secondary | ICD-10-CM | POA: Diagnosis not present

## 2018-11-11 DIAGNOSIS — Z23 Encounter for immunization: Secondary | ICD-10-CM | POA: Diagnosis not present

## 2018-11-11 DIAGNOSIS — D509 Iron deficiency anemia, unspecified: Secondary | ICD-10-CM | POA: Diagnosis not present

## 2018-11-17 DIAGNOSIS — E119 Type 2 diabetes mellitus without complications: Secondary | ICD-10-CM | POA: Diagnosis not present

## 2018-12-02 DIAGNOSIS — G4733 Obstructive sleep apnea (adult) (pediatric): Secondary | ICD-10-CM | POA: Diagnosis not present

## 2018-12-28 DIAGNOSIS — H52203 Unspecified astigmatism, bilateral: Secondary | ICD-10-CM | POA: Diagnosis not present

## 2018-12-28 DIAGNOSIS — E119 Type 2 diabetes mellitus without complications: Secondary | ICD-10-CM | POA: Diagnosis not present

## 2019-02-16 DIAGNOSIS — Z0000 Encounter for general adult medical examination without abnormal findings: Secondary | ICD-10-CM | POA: Diagnosis not present

## 2019-02-16 DIAGNOSIS — J301 Allergic rhinitis due to pollen: Secondary | ICD-10-CM | POA: Diagnosis not present

## 2019-02-16 DIAGNOSIS — E1140 Type 2 diabetes mellitus with diabetic neuropathy, unspecified: Secondary | ICD-10-CM | POA: Diagnosis not present

## 2019-02-16 DIAGNOSIS — I10 Essential (primary) hypertension: Secondary | ICD-10-CM | POA: Diagnosis not present

## 2019-02-16 DIAGNOSIS — E785 Hyperlipidemia, unspecified: Secondary | ICD-10-CM | POA: Diagnosis not present

## 2019-04-02 DIAGNOSIS — Z20822 Contact with and (suspected) exposure to covid-19: Secondary | ICD-10-CM | POA: Diagnosis not present

## 2019-05-10 DIAGNOSIS — E119 Type 2 diabetes mellitus without complications: Secondary | ICD-10-CM | POA: Diagnosis not present

## 2019-05-11 DIAGNOSIS — G4733 Obstructive sleep apnea (adult) (pediatric): Secondary | ICD-10-CM | POA: Diagnosis not present

## 2019-05-18 DIAGNOSIS — I10 Essential (primary) hypertension: Secondary | ICD-10-CM | POA: Diagnosis not present

## 2019-05-18 DIAGNOSIS — E119 Type 2 diabetes mellitus without complications: Secondary | ICD-10-CM | POA: Diagnosis not present

## 2019-05-18 DIAGNOSIS — Z6841 Body Mass Index (BMI) 40.0 and over, adult: Secondary | ICD-10-CM | POA: Diagnosis not present

## 2019-05-18 DIAGNOSIS — E785 Hyperlipidemia, unspecified: Secondary | ICD-10-CM | POA: Diagnosis not present

## 2019-06-15 ENCOUNTER — Other Ambulatory Visit: Payer: Self-pay | Admitting: Family Medicine

## 2019-06-15 DIAGNOSIS — Z1231 Encounter for screening mammogram for malignant neoplasm of breast: Secondary | ICD-10-CM

## 2019-06-19 ENCOUNTER — Ambulatory Visit
Admission: RE | Admit: 2019-06-19 | Discharge: 2019-06-19 | Disposition: A | Discharge status: Home / Self Care | Payer: BC Managed Care – PPO | Source: Ambulatory Visit | Attending: Family Medicine | Admitting: Family Medicine

## 2019-06-19 ENCOUNTER — Other Ambulatory Visit: Payer: Self-pay

## 2019-06-19 DIAGNOSIS — Z1231 Encounter for screening mammogram for malignant neoplasm of breast: Secondary | ICD-10-CM

## 2019-07-05 DIAGNOSIS — L720 Epidermal cyst: Secondary | ICD-10-CM | POA: Diagnosis not present

## 2019-07-05 DIAGNOSIS — D2261 Melanocytic nevi of right upper limb, including shoulder: Secondary | ICD-10-CM | POA: Diagnosis not present

## 2019-07-05 DIAGNOSIS — C44319 Basal cell carcinoma of skin of other parts of face: Secondary | ICD-10-CM | POA: Diagnosis not present

## 2019-07-05 DIAGNOSIS — D1801 Hemangioma of skin and subcutaneous tissue: Secondary | ICD-10-CM | POA: Diagnosis not present

## 2019-07-05 DIAGNOSIS — L814 Other melanin hyperpigmentation: Secondary | ICD-10-CM | POA: Diagnosis not present

## 2019-08-15 DIAGNOSIS — C44319 Basal cell carcinoma of skin of other parts of face: Secondary | ICD-10-CM | POA: Diagnosis not present

## 2019-10-10 DIAGNOSIS — G4733 Obstructive sleep apnea (adult) (pediatric): Secondary | ICD-10-CM | POA: Diagnosis not present

## 2019-11-16 DIAGNOSIS — E119 Type 2 diabetes mellitus without complications: Secondary | ICD-10-CM | POA: Diagnosis not present

## 2019-11-16 DIAGNOSIS — E785 Hyperlipidemia, unspecified: Secondary | ICD-10-CM | POA: Diagnosis not present

## 2019-11-21 DIAGNOSIS — E1140 Type 2 diabetes mellitus with diabetic neuropathy, unspecified: Secondary | ICD-10-CM | POA: Diagnosis not present

## 2019-11-21 DIAGNOSIS — E119 Type 2 diabetes mellitus without complications: Secondary | ICD-10-CM | POA: Diagnosis not present

## 2019-11-21 DIAGNOSIS — I10 Essential (primary) hypertension: Secondary | ICD-10-CM | POA: Diagnosis not present

## 2019-11-21 DIAGNOSIS — E785 Hyperlipidemia, unspecified: Secondary | ICD-10-CM | POA: Diagnosis not present

## 2020-01-01 DIAGNOSIS — E785 Hyperlipidemia, unspecified: Secondary | ICD-10-CM | POA: Diagnosis not present

## 2020-01-01 DIAGNOSIS — E1140 Type 2 diabetes mellitus with diabetic neuropathy, unspecified: Secondary | ICD-10-CM | POA: Diagnosis not present

## 2020-01-01 DIAGNOSIS — I10 Essential (primary) hypertension: Secondary | ICD-10-CM | POA: Diagnosis not present

## 2020-01-01 DIAGNOSIS — K1379 Other lesions of oral mucosa: Secondary | ICD-10-CM | POA: Diagnosis not present

## 2020-01-02 DIAGNOSIS — H02834 Dermatochalasis of left upper eyelid: Secondary | ICD-10-CM | POA: Diagnosis not present

## 2020-01-02 DIAGNOSIS — H02831 Dermatochalasis of right upper eyelid: Secondary | ICD-10-CM | POA: Diagnosis not present

## 2020-01-02 DIAGNOSIS — H52201 Unspecified astigmatism, right eye: Secondary | ICD-10-CM | POA: Diagnosis not present

## 2020-01-02 DIAGNOSIS — E119 Type 2 diabetes mellitus without complications: Secondary | ICD-10-CM | POA: Diagnosis not present

## 2020-01-03 DIAGNOSIS — Z20828 Contact with and (suspected) exposure to other viral communicable diseases: Secondary | ICD-10-CM | POA: Diagnosis not present

## 2020-01-15 DIAGNOSIS — J0100 Acute maxillary sinusitis, unspecified: Secondary | ICD-10-CM | POA: Diagnosis not present

## 2020-01-19 DIAGNOSIS — J019 Acute sinusitis, unspecified: Secondary | ICD-10-CM | POA: Diagnosis not present

## 2020-01-19 DIAGNOSIS — Z1152 Encounter for screening for COVID-19: Secondary | ICD-10-CM | POA: Diagnosis not present

## 2020-03-10 DIAGNOSIS — J209 Acute bronchitis, unspecified: Secondary | ICD-10-CM | POA: Diagnosis not present

## 2020-03-10 DIAGNOSIS — J189 Pneumonia, unspecified organism: Secondary | ICD-10-CM | POA: Diagnosis not present

## 2020-03-10 DIAGNOSIS — Z20828 Contact with and (suspected) exposure to other viral communicable diseases: Secondary | ICD-10-CM | POA: Diagnosis not present

## 2020-06-05 DIAGNOSIS — E119 Type 2 diabetes mellitus without complications: Secondary | ICD-10-CM | POA: Diagnosis not present

## 2020-06-12 DIAGNOSIS — E1140 Type 2 diabetes mellitus with diabetic neuropathy, unspecified: Secondary | ICD-10-CM | POA: Diagnosis not present

## 2020-06-12 DIAGNOSIS — I10 Essential (primary) hypertension: Secondary | ICD-10-CM | POA: Diagnosis not present

## 2020-06-12 DIAGNOSIS — E785 Hyperlipidemia, unspecified: Secondary | ICD-10-CM | POA: Diagnosis not present

## 2020-06-12 DIAGNOSIS — E119 Type 2 diabetes mellitus without complications: Secondary | ICD-10-CM | POA: Diagnosis not present

## 2020-07-03 DIAGNOSIS — E785 Hyperlipidemia, unspecified: Secondary | ICD-10-CM | POA: Diagnosis not present

## 2020-07-03 DIAGNOSIS — I10 Essential (primary) hypertension: Secondary | ICD-10-CM | POA: Diagnosis not present

## 2020-07-03 DIAGNOSIS — E1140 Type 2 diabetes mellitus with diabetic neuropathy, unspecified: Secondary | ICD-10-CM | POA: Diagnosis not present

## 2020-07-03 DIAGNOSIS — J301 Allergic rhinitis due to pollen: Secondary | ICD-10-CM | POA: Diagnosis not present

## 2020-09-17 DIAGNOSIS — D2262 Melanocytic nevi of left upper limb, including shoulder: Secondary | ICD-10-CM | POA: Diagnosis not present

## 2020-09-17 DIAGNOSIS — Z85828 Personal history of other malignant neoplasm of skin: Secondary | ICD-10-CM | POA: Diagnosis not present

## 2020-09-17 DIAGNOSIS — L821 Other seborrheic keratosis: Secondary | ICD-10-CM | POA: Diagnosis not present

## 2020-09-17 DIAGNOSIS — D1801 Hemangioma of skin and subcutaneous tissue: Secondary | ICD-10-CM | POA: Diagnosis not present

## 2020-10-26 IMAGING — MG DIGITAL SCREENING BILAT W/ TOMO W/ CAD
8 series · 9 of 24 positions shown · non-contrast
Comparison: Previous exam(s).

CLINICAL DATA: Screening. History of bilateral reduction
mammoplasty.

EXAM:
DIGITAL SCREENING BILATERAL MAMMOGRAM WITH TOMO AND CAD

[R CC synth-2D]
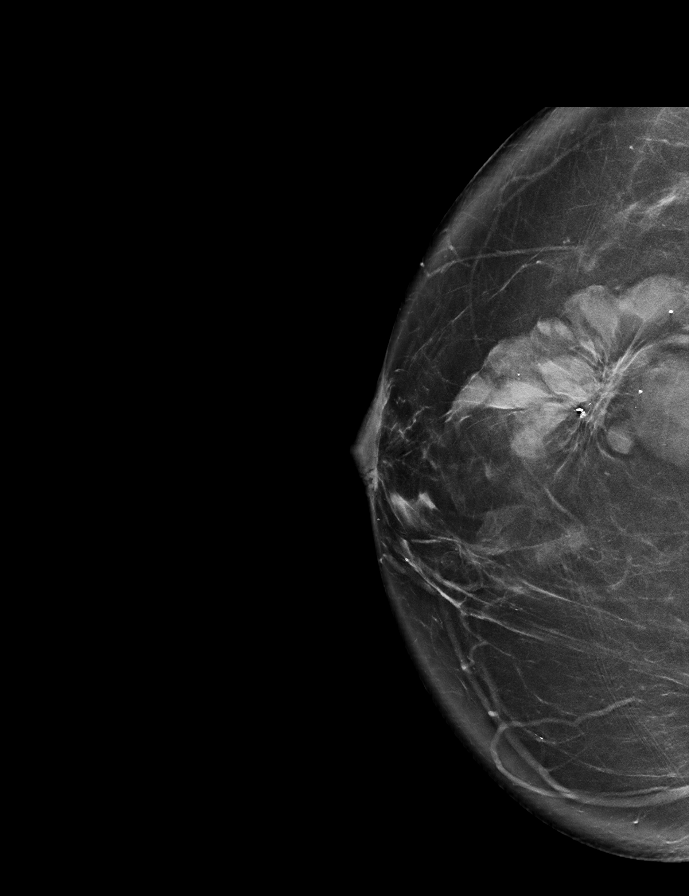

[R MLO synth-2D]
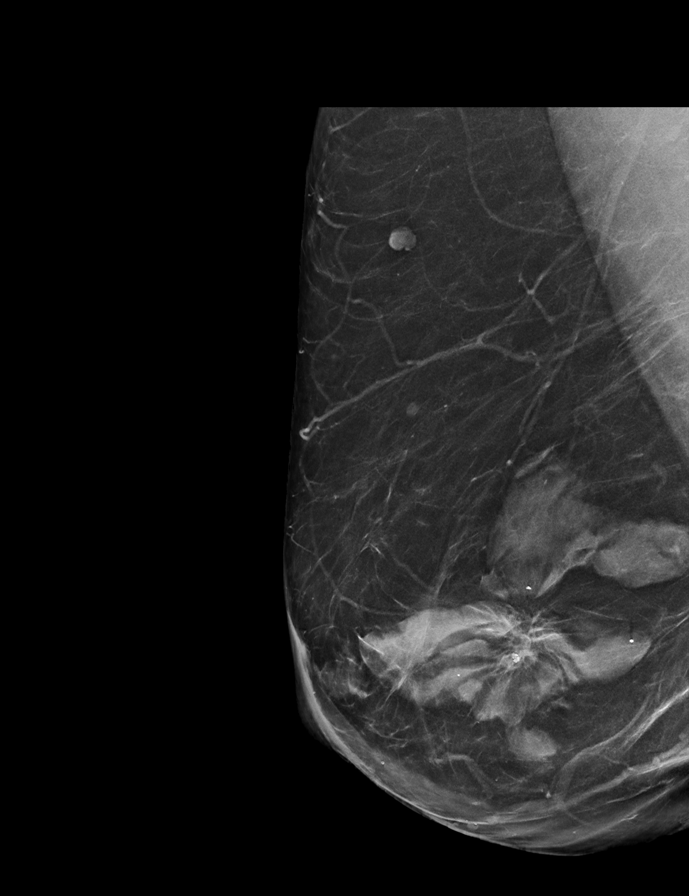

[L MLO synth-2D]
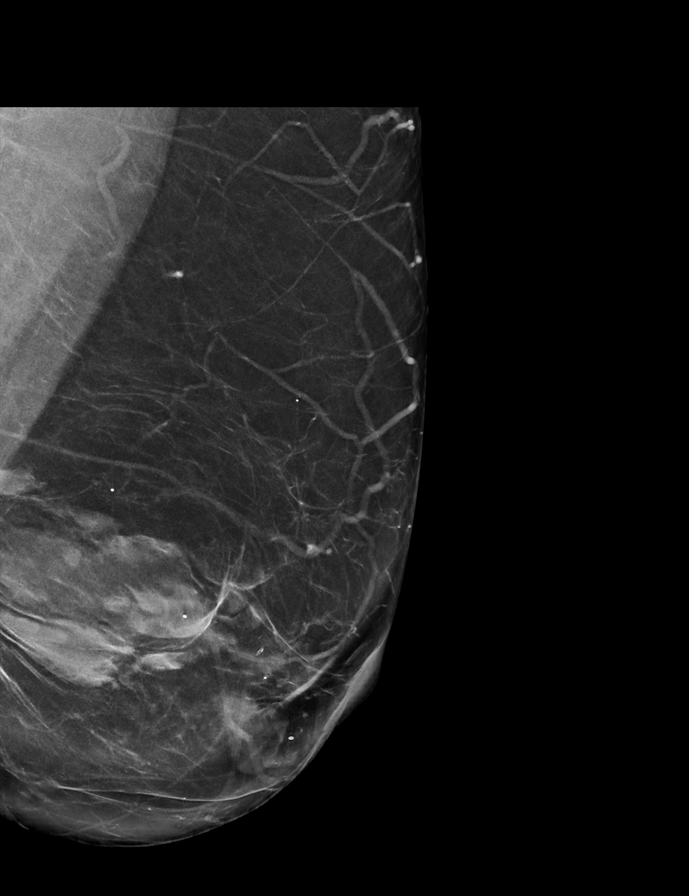

[L CC synth-2D]
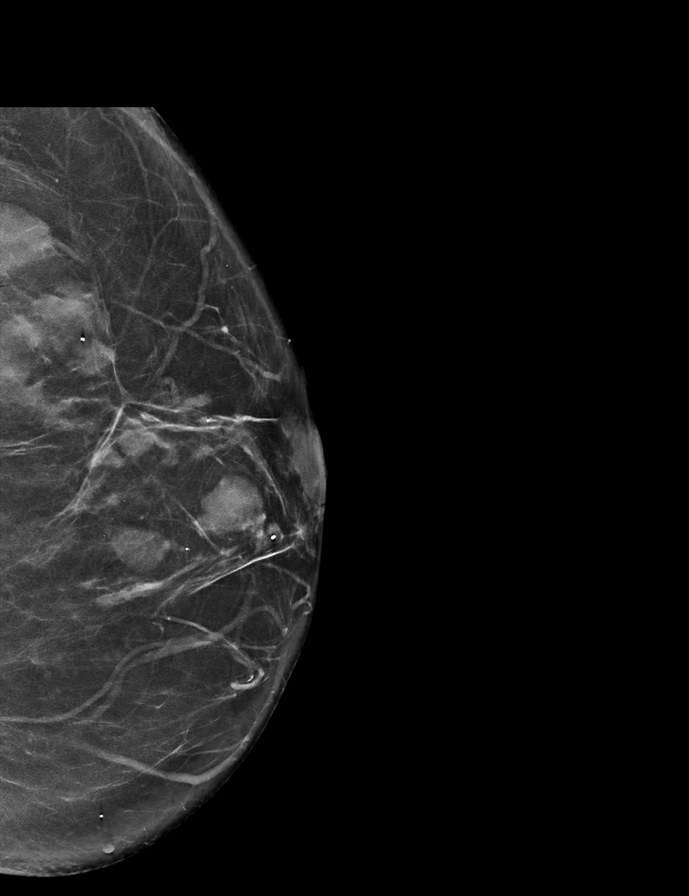

[R CC tomo · 2 of 70 frames shown]
[frame 23/70]
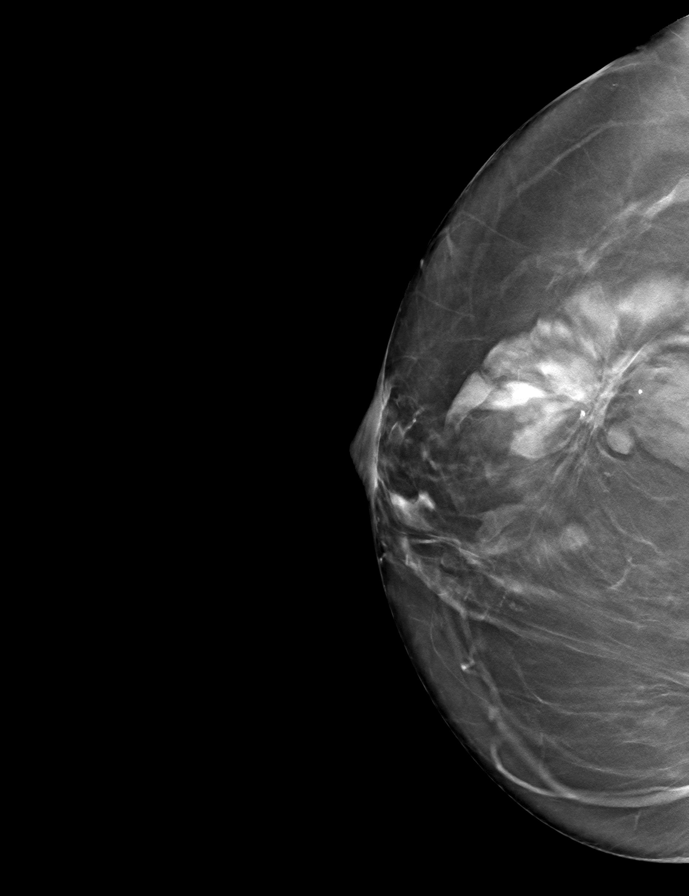
[frame 35/70]
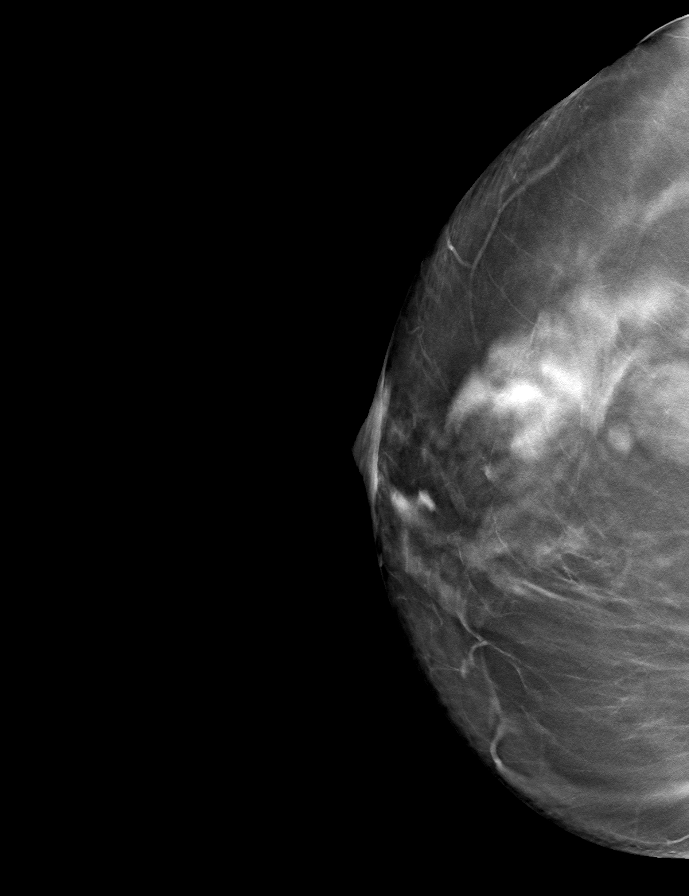

[R MLO tomo · tomo slice 39/76.0]
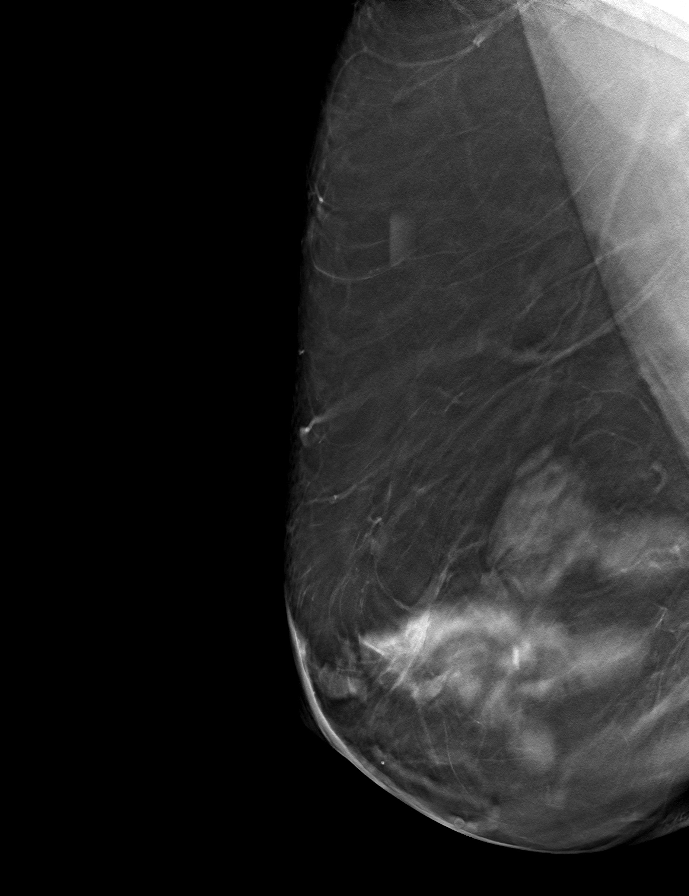

[L CC tomo · tomo slice 32/63.0]
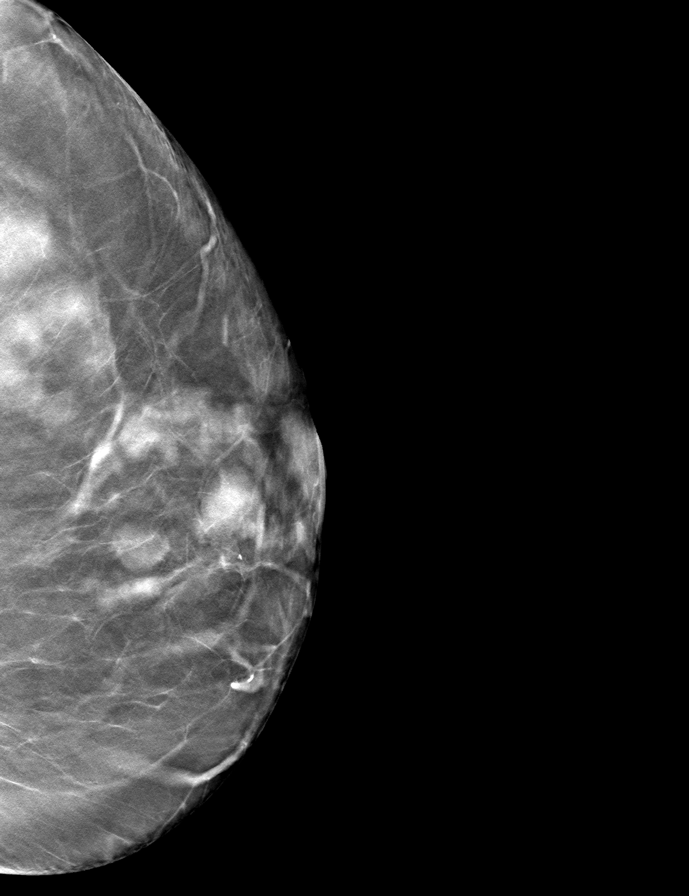

[L MLO tomo · tomo slice 36/71.0]
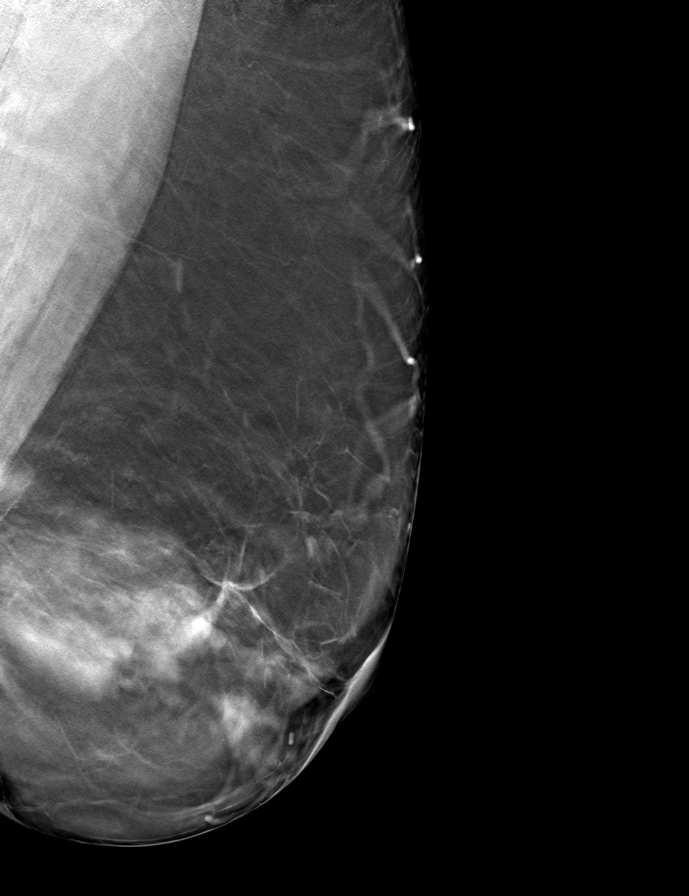

[9 of 24 positions shown; findings below may reference images not displayed]

ACR Breast Density Category c: The breast tissue is heterogeneously
dense, which may obscure small masses.
FINDINGS: There are no findings suspicious for malignancy. Images were
processed with CAD.
IMPRESSION: No mammographic evidence of malignancy. A result letter of this
screening mammogram will be mailed directly to the patient.

RECOMMENDATION:
Screening mammogram in one year. (Code:[id number]-H-8H9)

BI-RADS CATEGORY  1: Negative.

## 2020-12-18 DIAGNOSIS — E119 Type 2 diabetes mellitus without complications: Secondary | ICD-10-CM | POA: Diagnosis not present

## 2020-12-18 DIAGNOSIS — E1140 Type 2 diabetes mellitus with diabetic neuropathy, unspecified: Secondary | ICD-10-CM | POA: Diagnosis not present

## 2020-12-18 DIAGNOSIS — E785 Hyperlipidemia, unspecified: Secondary | ICD-10-CM | POA: Diagnosis not present

## 2020-12-18 DIAGNOSIS — I10 Essential (primary) hypertension: Secondary | ICD-10-CM | POA: Diagnosis not present

## 2021-01-07 DIAGNOSIS — E1140 Type 2 diabetes mellitus with diabetic neuropathy, unspecified: Secondary | ICD-10-CM | POA: Diagnosis not present

## 2021-01-07 DIAGNOSIS — I10 Essential (primary) hypertension: Secondary | ICD-10-CM | POA: Diagnosis not present

## 2021-01-07 DIAGNOSIS — E785 Hyperlipidemia, unspecified: Secondary | ICD-10-CM | POA: Diagnosis not present

## 2021-01-07 DIAGNOSIS — H524 Presbyopia: Secondary | ICD-10-CM | POA: Diagnosis not present

## 2021-01-07 DIAGNOSIS — H5203 Hypermetropia, bilateral: Secondary | ICD-10-CM | POA: Diagnosis not present

## 2021-01-07 DIAGNOSIS — E119 Type 2 diabetes mellitus without complications: Secondary | ICD-10-CM | POA: Diagnosis not present

## 2021-01-07 DIAGNOSIS — Z23 Encounter for immunization: Secondary | ICD-10-CM | POA: Diagnosis not present

## 2021-06-10 DIAGNOSIS — E119 Type 2 diabetes mellitus without complications: Secondary | ICD-10-CM | POA: Diagnosis not present

## 2021-06-17 DIAGNOSIS — E785 Hyperlipidemia, unspecified: Secondary | ICD-10-CM | POA: Diagnosis not present

## 2021-06-17 DIAGNOSIS — E119 Type 2 diabetes mellitus without complications: Secondary | ICD-10-CM | POA: Diagnosis not present

## 2021-06-17 DIAGNOSIS — I10 Essential (primary) hypertension: Secondary | ICD-10-CM | POA: Diagnosis not present

## 2021-06-17 DIAGNOSIS — E1140 Type 2 diabetes mellitus with diabetic neuropathy, unspecified: Secondary | ICD-10-CM | POA: Diagnosis not present

## 2021-07-10 DIAGNOSIS — E1140 Type 2 diabetes mellitus with diabetic neuropathy, unspecified: Secondary | ICD-10-CM | POA: Diagnosis not present

## 2021-07-10 DIAGNOSIS — I10 Essential (primary) hypertension: Secondary | ICD-10-CM | POA: Diagnosis not present

## 2021-07-10 DIAGNOSIS — E785 Hyperlipidemia, unspecified: Secondary | ICD-10-CM | POA: Diagnosis not present

## 2021-10-28 DIAGNOSIS — Z85828 Personal history of other malignant neoplasm of skin: Secondary | ICD-10-CM | POA: Diagnosis not present

## 2021-10-28 DIAGNOSIS — L738 Other specified follicular disorders: Secondary | ICD-10-CM | POA: Diagnosis not present

## 2021-10-28 DIAGNOSIS — L918 Other hypertrophic disorders of the skin: Secondary | ICD-10-CM | POA: Diagnosis not present

## 2021-10-28 DIAGNOSIS — L821 Other seborrheic keratosis: Secondary | ICD-10-CM | POA: Diagnosis not present

## 2021-11-10 DIAGNOSIS — L259 Unspecified contact dermatitis, unspecified cause: Secondary | ICD-10-CM | POA: Diagnosis not present

## 2021-12-09 DIAGNOSIS — M25572 Pain in left ankle and joints of left foot: Secondary | ICD-10-CM | POA: Diagnosis not present

## 2021-12-23 DIAGNOSIS — M25572 Pain in left ankle and joints of left foot: Secondary | ICD-10-CM | POA: Diagnosis not present

## 2022-01-07 DIAGNOSIS — I10 Essential (primary) hypertension: Secondary | ICD-10-CM | POA: Diagnosis not present

## 2022-01-07 DIAGNOSIS — E1140 Type 2 diabetes mellitus with diabetic neuropathy, unspecified: Secondary | ICD-10-CM | POA: Diagnosis not present

## 2022-01-07 DIAGNOSIS — E785 Hyperlipidemia, unspecified: Secondary | ICD-10-CM | POA: Diagnosis not present

## 2022-01-13 DIAGNOSIS — H02831 Dermatochalasis of right upper eyelid: Secondary | ICD-10-CM | POA: Diagnosis not present

## 2022-01-13 DIAGNOSIS — E119 Type 2 diabetes mellitus without complications: Secondary | ICD-10-CM | POA: Diagnosis not present

## 2022-01-13 DIAGNOSIS — H02403 Unspecified ptosis of bilateral eyelids: Secondary | ICD-10-CM | POA: Diagnosis not present

## 2022-01-13 DIAGNOSIS — H02834 Dermatochalasis of left upper eyelid: Secondary | ICD-10-CM | POA: Diagnosis not present

## 2022-01-13 DIAGNOSIS — H524 Presbyopia: Secondary | ICD-10-CM | POA: Diagnosis not present

## 2022-01-16 DIAGNOSIS — M25572 Pain in left ankle and joints of left foot: Secondary | ICD-10-CM | POA: Diagnosis not present

## 2022-01-20 DIAGNOSIS — J209 Acute bronchitis, unspecified: Secondary | ICD-10-CM | POA: Diagnosis not present

## 2022-01-20 DIAGNOSIS — R051 Acute cough: Secondary | ICD-10-CM | POA: Diagnosis not present

## 2022-01-20 DIAGNOSIS — J0100 Acute maxillary sinusitis, unspecified: Secondary | ICD-10-CM | POA: Diagnosis not present

## 2022-01-27 DIAGNOSIS — R051 Acute cough: Secondary | ICD-10-CM | POA: Diagnosis not present

## 2022-03-26 ENCOUNTER — Other Ambulatory Visit: Payer: Self-pay | Admitting: Family Medicine

## 2022-03-26 DIAGNOSIS — E785 Hyperlipidemia, unspecified: Secondary | ICD-10-CM | POA: Diagnosis not present

## 2022-03-26 DIAGNOSIS — I10 Essential (primary) hypertension: Secondary | ICD-10-CM | POA: Diagnosis not present

## 2022-03-26 DIAGNOSIS — M8588 Other specified disorders of bone density and structure, other site: Secondary | ICD-10-CM | POA: Diagnosis not present

## 2022-03-26 DIAGNOSIS — Z0000 Encounter for general adult medical examination without abnormal findings: Secondary | ICD-10-CM | POA: Diagnosis not present

## 2022-03-26 DIAGNOSIS — E1140 Type 2 diabetes mellitus with diabetic neuropathy, unspecified: Secondary | ICD-10-CM | POA: Diagnosis not present

## 2022-03-26 DIAGNOSIS — Z1231 Encounter for screening mammogram for malignant neoplasm of breast: Secondary | ICD-10-CM

## 2022-03-26 DIAGNOSIS — G4733 Obstructive sleep apnea (adult) (pediatric): Secondary | ICD-10-CM | POA: Diagnosis not present

## 2022-03-26 DIAGNOSIS — M8580 Other specified disorders of bone density and structure, unspecified site: Secondary | ICD-10-CM

## 2022-03-26 DIAGNOSIS — K760 Fatty (change of) liver, not elsewhere classified: Secondary | ICD-10-CM | POA: Diagnosis not present

## 2022-04-23 DIAGNOSIS — M7542 Impingement syndrome of left shoulder: Secondary | ICD-10-CM | POA: Diagnosis not present

## 2022-05-15 DIAGNOSIS — M6281 Muscle weakness (generalized): Secondary | ICD-10-CM | POA: Diagnosis not present

## 2022-05-15 DIAGNOSIS — M25512 Pain in left shoulder: Secondary | ICD-10-CM | POA: Diagnosis not present

## 2022-05-18 DIAGNOSIS — M6281 Muscle weakness (generalized): Secondary | ICD-10-CM | POA: Diagnosis not present

## 2022-05-18 DIAGNOSIS — M25512 Pain in left shoulder: Secondary | ICD-10-CM | POA: Diagnosis not present

## 2022-05-19 ENCOUNTER — Ambulatory Visit
Admission: RE | Admit: 2022-05-19 | Discharge: 2022-05-19 | Disposition: A | Discharge status: Home / Self Care | Payer: BC Managed Care – PPO | Source: Ambulatory Visit | Attending: Family Medicine | Admitting: Family Medicine

## 2022-05-19 DIAGNOSIS — Z1231 Encounter for screening mammogram for malignant neoplasm of breast: Secondary | ICD-10-CM

## 2022-05-25 DIAGNOSIS — Z1211 Encounter for screening for malignant neoplasm of colon: Secondary | ICD-10-CM | POA: Diagnosis not present

## 2022-06-09 DIAGNOSIS — M25512 Pain in left shoulder: Secondary | ICD-10-CM | POA: Diagnosis not present

## 2022-06-09 DIAGNOSIS — M6281 Muscle weakness (generalized): Secondary | ICD-10-CM | POA: Diagnosis not present

## 2022-06-16 DIAGNOSIS — M6281 Muscle weakness (generalized): Secondary | ICD-10-CM | POA: Diagnosis not present

## 2022-06-16 DIAGNOSIS — M25512 Pain in left shoulder: Secondary | ICD-10-CM | POA: Diagnosis not present

## 2022-07-08 DIAGNOSIS — E1140 Type 2 diabetes mellitus with diabetic neuropathy, unspecified: Secondary | ICD-10-CM | POA: Diagnosis not present

## 2022-07-08 DIAGNOSIS — E785 Hyperlipidemia, unspecified: Secondary | ICD-10-CM | POA: Diagnosis not present

## 2022-07-15 DIAGNOSIS — I10 Essential (primary) hypertension: Secondary | ICD-10-CM | POA: Diagnosis not present

## 2022-07-15 DIAGNOSIS — E1140 Type 2 diabetes mellitus with diabetic neuropathy, unspecified: Secondary | ICD-10-CM | POA: Diagnosis not present

## 2022-07-15 DIAGNOSIS — E785 Hyperlipidemia, unspecified: Secondary | ICD-10-CM | POA: Diagnosis not present

## 2022-07-15 DIAGNOSIS — E119 Type 2 diabetes mellitus without complications: Secondary | ICD-10-CM | POA: Diagnosis not present

## 2022-08-24 DIAGNOSIS — K635 Polyp of colon: Secondary | ICD-10-CM | POA: Diagnosis not present

## 2022-08-24 DIAGNOSIS — Z09 Encounter for follow-up examination after completed treatment for conditions other than malignant neoplasm: Secondary | ICD-10-CM | POA: Diagnosis not present

## 2022-08-24 DIAGNOSIS — D125 Benign neoplasm of sigmoid colon: Secondary | ICD-10-CM | POA: Diagnosis not present

## 2022-08-24 DIAGNOSIS — K648 Other hemorrhoids: Secondary | ICD-10-CM | POA: Diagnosis not present

## 2022-08-24 DIAGNOSIS — Z86010 Personal history of colonic polyps: Secondary | ICD-10-CM | POA: Diagnosis not present

## 2022-09-09 ENCOUNTER — Ambulatory Visit: Admission: RE | Admit: 2022-09-09 | Payer: BC Managed Care – PPO | Source: Ambulatory Visit

## 2022-09-09 DIAGNOSIS — E349 Endocrine disorder, unspecified: Secondary | ICD-10-CM | POA: Diagnosis not present

## 2022-09-09 DIAGNOSIS — M8580 Other specified disorders of bone density and structure, unspecified site: Secondary | ICD-10-CM

## 2022-09-09 DIAGNOSIS — N950 Postmenopausal bleeding: Secondary | ICD-10-CM | POA: Diagnosis not present

## 2022-09-09 DIAGNOSIS — M8588 Other specified disorders of bone density and structure, other site: Secondary | ICD-10-CM | POA: Diagnosis not present

## 2022-10-13 DIAGNOSIS — E1169 Type 2 diabetes mellitus with other specified complication: Secondary | ICD-10-CM | POA: Diagnosis not present

## 2022-10-13 DIAGNOSIS — I10 Essential (primary) hypertension: Secondary | ICD-10-CM | POA: Diagnosis not present

## 2022-10-13 DIAGNOSIS — E785 Hyperlipidemia, unspecified: Secondary | ICD-10-CM | POA: Diagnosis not present

## 2022-10-14 ENCOUNTER — Other Ambulatory Visit: Payer: Self-pay | Admitting: Family Medicine

## 2022-10-14 DIAGNOSIS — M7989 Other specified soft tissue disorders: Secondary | ICD-10-CM

## 2022-10-30 DIAGNOSIS — E119 Type 2 diabetes mellitus without complications: Secondary | ICD-10-CM | POA: Diagnosis not present

## 2022-11-02 ENCOUNTER — Ambulatory Visit
Admission: RE | Admit: 2022-11-02 | Discharge: 2022-11-02 | Disposition: A | Discharge status: Home / Self Care | Payer: BC Managed Care – PPO | Source: Ambulatory Visit | Attending: Family Medicine | Admitting: Family Medicine

## 2022-11-02 DIAGNOSIS — R1902 Left upper quadrant abdominal swelling, mass and lump: Secondary | ICD-10-CM | POA: Diagnosis not present

## 2022-11-02 DIAGNOSIS — M7989 Other specified soft tissue disorders: Secondary | ICD-10-CM

## 2022-11-04 ENCOUNTER — Other Ambulatory Visit: Payer: BC Managed Care – PPO

## 2022-11-04 DIAGNOSIS — E119 Type 2 diabetes mellitus without complications: Secondary | ICD-10-CM | POA: Diagnosis not present

## 2022-11-04 DIAGNOSIS — E785 Hyperlipidemia, unspecified: Secondary | ICD-10-CM | POA: Diagnosis not present

## 2022-11-04 DIAGNOSIS — I10 Essential (primary) hypertension: Secondary | ICD-10-CM | POA: Diagnosis not present

## 2022-11-04 DIAGNOSIS — Z23 Encounter for immunization: Secondary | ICD-10-CM | POA: Diagnosis not present

## 2022-11-13 ENCOUNTER — Other Ambulatory Visit: Payer: Self-pay | Admitting: Family Medicine

## 2022-11-13 DIAGNOSIS — M7989 Other specified soft tissue disorders: Secondary | ICD-10-CM

## 2023-01-26 DIAGNOSIS — H5203 Hypermetropia, bilateral: Secondary | ICD-10-CM | POA: Diagnosis not present

## 2023-01-26 DIAGNOSIS — E113293 Type 2 diabetes mellitus with mild nonproliferative diabetic retinopathy without macular edema, bilateral: Secondary | ICD-10-CM | POA: Diagnosis not present

## 2023-02-21 ENCOUNTER — Emergency Department (HOSPITAL_BASED_OUTPATIENT_CLINIC_OR_DEPARTMENT_OTHER): Payer: BC Managed Care – PPO

## 2023-02-21 ENCOUNTER — Other Ambulatory Visit: Payer: Self-pay

## 2023-02-21 ENCOUNTER — Emergency Department (HOSPITAL_BASED_OUTPATIENT_CLINIC_OR_DEPARTMENT_OTHER)
Admission: EM | Admit: 2023-02-21 | Discharge: 2023-02-21 | Disposition: A | Discharge status: Home / Self Care | Payer: BC Managed Care – PPO | Attending: Student | Admitting: Student

## 2023-02-21 ENCOUNTER — Encounter (HOSPITAL_BASED_OUTPATIENT_CLINIC_OR_DEPARTMENT_OTHER): Payer: Self-pay | Admitting: Emergency Medicine

## 2023-02-21 DIAGNOSIS — R42 Dizziness and giddiness: Secondary | ICD-10-CM | POA: Insufficient documentation

## 2023-02-21 DIAGNOSIS — Z79899 Other long term (current) drug therapy: Secondary | ICD-10-CM | POA: Diagnosis not present

## 2023-02-21 DIAGNOSIS — E119 Type 2 diabetes mellitus without complications: Secondary | ICD-10-CM | POA: Diagnosis not present

## 2023-02-21 DIAGNOSIS — Z7984 Long term (current) use of oral hypoglycemic drugs: Secondary | ICD-10-CM | POA: Insufficient documentation

## 2023-02-21 DIAGNOSIS — I10 Essential (primary) hypertension: Secondary | ICD-10-CM | POA: Diagnosis not present

## 2023-02-21 DIAGNOSIS — Z7982 Long term (current) use of aspirin: Secondary | ICD-10-CM | POA: Insufficient documentation

## 2023-02-21 LAB — URINALYSIS, MICROSCOPIC (REFLEX): RBC / HPF: NONE SEEN RBC/hpf (ref 0–5)

## 2023-02-21 LAB — BASIC METABOLIC PANEL
Anion gap: 13 (ref 5–15)
BUN: 19 mg/dL (ref 8–23)
CO2: 22 mmol/L (ref 22–32)
Calcium: 9.1 mg/dL (ref 8.9–10.3)
Chloride: 102 mmol/L (ref 98–111)
Creatinine, Ser: 0.88 mg/dL (ref 0.44–1.00)
GFR, Estimated: >60 mL/min (ref >60)
Glucose, Bld: 182 mg/dL — ABNORMAL HIGH (ref 70–99)
Potassium: 3.7 mmol/L (ref 3.5–5.1)
Sodium: 137 mmol/L (ref 135–145)

## 2023-02-21 LAB — CBC WITH DIFFERENTIAL/PLATELET
Abs Immature Granulocytes: 0.08 10*3/uL — ABNORMAL HIGH (ref 0.00–0.07)
Basophils Absolute: 0.1 10*3/uL (ref 0.0–0.1)
Basophils Relative: 1 %
Eosinophils Absolute: 0.2 10*3/uL (ref 0.0–0.5)
Eosinophils Relative: 2 %
HCT: 44.6 % (ref 36.0–46.0)
Hemoglobin: 14.7 g/dL (ref 12.0–15.0)
Immature Granulocytes: 1 %
Lymphocytes Relative: 30 %
Lymphs Abs: 2.9 10*3/uL (ref 0.7–4.0)
MCH: 27.5 pg (ref 26.0–34.0)
MCHC: 33 g/dL (ref 30.0–36.0)
MCV: 83.5 fL (ref 80.0–100.0)
Monocytes Absolute: 0.6 10*3/uL (ref 0.1–1.0)
Monocytes Relative: 6 %
Neutro Abs: 6 10*3/uL (ref 1.7–7.7)
Neutrophils Relative %: 60 %
Platelets: 223 10*3/uL (ref 150–400)
RBC: 5.34 MIL/uL — ABNORMAL HIGH (ref 3.87–5.11)
RDW: 14.7 % (ref 11.5–15.5)
WBC: 9.8 10*3/uL (ref 4.0–10.5)
nRBC: 0 % (ref 0.0–0.2)

## 2023-02-21 LAB — URINALYSIS, ROUTINE W REFLEX MICROSCOPIC
Bilirubin Urine: NEGATIVE
Glucose, UA: >=500 mg/dL — AB
Hgb urine dipstick: NEGATIVE
Ketones, ur: NEGATIVE mg/dL
Leukocytes,Ua: NEGATIVE
Nitrite: NEGATIVE
Protein, ur: NEGATIVE mg/dL
Specific Gravity, Urine: 1.015 (ref 1.005–1.030)
pH: 5 (ref 5.0–8.0)

## 2023-02-21 LAB — CBG MONITORING, ED: Glucose-Capillary: 159 mg/dL — ABNORMAL HIGH (ref 70–99)

## 2023-02-21 MED ORDER — LISINOPRIL 10 MG PO TABS
20.0000 mg | ORAL_TABLET | Freq: Every day | ORAL | Status: DC
  Administered 2023-02-21: 20 mg via ORAL
  Filled 2023-02-21: qty 2

## 2023-02-21 MED ORDER — MECLIZINE HCL 25 MG PO TABS: 25.0000 mg | ORAL_TABLET | Freq: Three times a day (TID) | ORAL | 0 refills | Status: AC | PRN

## 2023-02-21 MED ORDER — MECLIZINE HCL 25 MG PO TABS
25.0000 mg | ORAL_TABLET | Freq: Once | ORAL | Status: AC
  Administered 2023-02-21: 25 mg via ORAL
  Filled 2023-02-21: qty 1

## 2023-02-21 NOTE — ED Triage Notes (Signed)
 Pt c/o dizziness and nausea that caused her to stumble, hitting her LT side on something Friday night, reports bruising to LUE and LT side trunk; dizziness has been constant since Fri night when she looks down; no nausea now

## 2023-02-21 NOTE — ED Provider Notes (Signed)
 [place] [facility]ERGENCY DEPARTMENT AT [facility] Provider Note  CSN: [id number] Arrival date & time: 02/21/23 1436  Chief Complaint(s) Dizziness  HPI Diane Sherman is a 68 y.o. female with PMH HTN, HLD, T2DM who presents emergency room for evaluation of dizziness and a fall.  Patient states that 48 hours ago she was using the restroom, stood up and had sudden onset positional vertigo.  States that symptoms are present only when moving her head downward.  Symptoms are fatigable and symptoms resolve when she moves her head back upwards.  She states that she suffered a fall where she lowered self to the ground but struck her right lateral ribs against the toilet on Friday.  Endorsing some mild pain on that side but denies any shortness of breath, vomiting, headache, fever or other systemic symptoms.  No weight loss or night sweats.  Of note, patient did have an upper respiratory illness over the Christmas holiday that has since resolved.   Past Medical History Past Medical History:  Diagnosis Date   Diabetes mellitus without complication (HCC)    Hyperlipidemia    Hypertension    There are no active problems to display for this patient.  Home Medication(s) Prior to Admission medications   Medication Sig Start Date End Date Taking? Authorizing Provider  meclizine  (ANTIVERT ) 25 MG tablet Take 1 tablet (25 mg total) by mouth 3 (three) times daily as needed for dizziness. 02/21/23  Yes Zohair Epp, MD  aspirin EC 81 MG tablet Take 81 mg by mouth daily.    [provider]  Dulaglutide (TRULICITY) 1.5 MG/0.5ML SOPN Inject into the skin.    [provider]  empagliflozin (JARDIANCE) 25 MG TABS tablet Take 25 mg by mouth daily.    [provider]  fexofenadine (ALLEGRA) 180 MG tablet Take 180 mg by mouth daily.    [provider]  fluticasone (FLONASE) 50 MCG/ACT nasal spray Place into both nostrils daily.    [provider]   gabapentin (NEURONTIN) 100 MG capsule Take 100 mg by mouth 3 (three) times daily.    [provider]  HYDROcodone -acetaminophen  (NORCO/VICODIN) 5-325 MG tablet Take 1 tablet by mouth every 6 (six) hours as needed for moderate pain. 10/04/16   Lawyer, Lonni, PA-C  lisinopril  (PRINIVIL ,ZESTRIL ) 20 MG tablet Take 20 mg by mouth daily.    [provider]  metFORMIN (GLUCOPHAGE) 500 MG tablet Take 1,000 mg by mouth 2 (two) times daily with a meal.    [provider]  omeprazole (PRILOSEC) 40 MG capsule Take 40 mg by mouth daily.    [provider]  simvastatin (ZOCOR) 20 MG tablet Take 20 mg by mouth daily.    [provider]                                                                                                                                    Past Surgical  History Past Surgical History:  Procedure Laterality Date   BREAST SURGERY     breast reduction   CESAREAN SECTION     REDUCTION MAMMAPLASTY     Family History History reviewed. No pertinent family history.  Social History Social History   Tobacco Use   Smoking status: Never   Smokeless tobacco: Never  Vaping Use   Vaping status: Never Used  Substance Use Topics   Alcohol use: No   Drug use: No   Allergies Patient has no known allergies.  Review of Systems Review of Systems  Neurological:  Positive for dizziness.    Physical Exam Vital Signs  I have reviewed the triage vital signs BP (!) 160/103   Pulse 87   Temp 98.5 F (36.9 C) (Oral)   Resp 18   Ht 5' 5 (1.651 m)   Wt 108 kg   SpO2 98%   BMI 39.61 kg/m   Physical Exam Vitals and nursing note reviewed.  Constitutional:      General: She is not in acute distress.    Appearance: She is well-developed.  HENT:     Head: Normocephalic and atraumatic.  Eyes:     Conjunctiva/sclera: Conjunctivae normal.  Cardiovascular:     Rate and Rhythm: Normal rate and regular rhythm.     Heart sounds: No  murmur heard. Pulmonary:     Effort: Pulmonary effort is normal. No respiratory distress.     Breath sounds: Normal breath sounds.  Abdominal:     Palpations: Abdomen is soft.     Tenderness: There is no abdominal tenderness.  Musculoskeletal:        General: No swelling.     Cervical back: Neck supple.  Skin:    General: Skin is warm and dry.     Capillary Refill: Capillary refill takes less than 2 seconds.  Neurological:     Mental Status: She is alert.     Cranial Nerves: No cranial nerve deficit.     Sensory: No sensory deficit.     Motor: No weakness.  Psychiatric:        Mood and Affect: Mood normal.     ED Results and Treatments Labs (all labs ordered are listed, but only abnormal results are displayed) Labs Reviewed  BASIC METABOLIC PANEL - Abnormal; Notable for the following components:      Result Value   Glucose, Bld 182 (*)    All other components within normal limits  URINALYSIS, ROUTINE W REFLEX MICROSCOPIC - Abnormal; Notable for the following components:   Glucose, UA >=500 (*)    All other components within normal limits  CBC WITH DIFFERENTIAL/PLATELET - Abnormal; Notable for the following components:   RBC 5.34 (*)    Abs Immature Granulocytes 0.08 (*)    All other components within normal limits  URINALYSIS, MICROSCOPIC (REFLEX) - Abnormal; Notable for the following components:   Bacteria, UA RARE (*)    All other components within normal limits  CBG MONITORING, ED - Abnormal; Notable for the following components:   Glucose-Capillary 159 (*)    All other components within normal limits  Radiology CT HEAD WO CONTRAST Result Date: 02/21/2023 CLINICAL DATA:  Dizziness and nausea EXAM: CT HEAD WITHOUT CONTRAST TECHNIQUE: Contiguous axial images were obtained from the base of the skull through the vertex without intravenous contrast.  RADIATION DOSE REDUCTION: This exam was performed according to the departmental dose-optimization program which includes automated exposure control, adjustment of the mA and/or kV according to patient size and/or use of iterative reconstruction technique. COMPARISON:  None Available. FINDINGS: Brain: No evidence of acute infarction, hemorrhage, mass, mass effect, or midline shift. No hydrocephalus or extra-axial fluid collection. Vascular: No hyperdense vessel. Skull: Negative for fracture or focal lesion. Hyperostosis frontalis. Sinuses/Orbits: No acute finding. Other: The mastoid air cells are well aerated. IMPRESSION: No acute intracranial process. Electronically Signed   By: Donald Campion M.D.   On: 02/21/2023 16:47    Pertinent labs & imaging results that were available during my care of the patient were reviewed by me and considered in my medical decision making (see MDM for details).  Medications Ordered in ED Medications  meclizine  (ANTIVERT ) tablet 25 mg (25 mg Oral Given 02/21/23 1919)                                                                                                                                     Procedures Procedures  (including critical care time)  Medical Decision Making / ED Course   This patient presents to the ED for concern of vertigo, this involves an extensive number of treatment options, and is a complaint that carries with it a high risk of complications and morbidity.  The differential diagnosis includes BPPV, CVA, electrolyte abnormality, labyrinthitis/vestibular neuritis, Meniere's disease  MDM: Patient seen emergency room for evaluation of vertigo.  Physical exam largely unremarkable including no focal motor or sensory deficits, no cranial nerve deficits.  Gait is normal but patient becomes slightly unsteady when moving her head downward.  There is no blurry vision or diplopia when head is Still and patient looks downward.  Vertigo only induced when  physically moving head up and down.  Laboratory evaluation is largely unremarkable.  Patient given meclizine  and on reevaluation her symptoms have significantly improved.  She is able to ambulate with out difficulty and able to move her head up and down without difficulty.  Suspect patient likely suffering from BPPV vs vestibular neuritis especially in the setting of her recent URI.  As symptoms have improved, are fatigable and inducible, I have low suspicion for central stroke and MRI was deferred.  CT head is normal today. With symptoms improved she does not meet inpatient criteria for admission and will be discharged with outpatient follow-up.  There is some slight bruising over the lateral chest wall on the right but very minimal tenderness to palpation and I have overall lower suspicion for rib fracture at this time and we will defer imaging of the chest.  Patient states she is not having any  shortness of breath or pleurisy.  Return precautions given and patient discharged with outpatient follow-up.   Additional history obtained: -Additional history obtained from husband -External records from outside source obtained and reviewed including: Chart review including previous notes, labs, imaging, consultation notes   Lab Tests: -I ordered, reviewed, and interpreted labs.   The pertinent results include:   Labs Reviewed  BASIC METABOLIC PANEL - Abnormal; Notable for the following components:      Result Value   Glucose, Bld 182 (*)    All other components within normal limits  URINALYSIS, ROUTINE W REFLEX MICROSCOPIC - Abnormal; Notable for the following components:   Glucose, UA >=500 (*)    All other components within normal limits  CBC WITH DIFFERENTIAL/PLATELET - Abnormal; Notable for the following components:   RBC 5.34 (*)    Abs Immature Granulocytes 0.08 (*)    All other components within normal limits  URINALYSIS, MICROSCOPIC (REFLEX) - Abnormal; Notable for the following components:    Bacteria, UA RARE (*)    All other components within normal limits  CBG MONITORING, ED - Abnormal; Notable for the following components:   Glucose-Capillary 159 (*)    All other components within normal limits      EKG   EKG Interpretation Date/Time:  Sunday February 21 2023 15:10:14 EST Ventricular Rate:  98 PR Interval:  154 QRS Duration:  80 QT Interval:  338 QTC Calculation: 431 R Axis:   35  Text Interpretation: Normal sinus rhythm Low voltage QRS Cannot rule out Anterior infarct , age undetermined Abnormal ECG No previous ECGs available Confirmed by Earleen Aoun ([id number]) on 02/21/2023 7:38:41 PM         Imaging Studies ordered: I ordered imaging studies including CT head I independently visualized and interpreted imaging. I agree with the radiologist interpretation   Medicines ordered and prescription drug management: Meds ordered this encounter  Medications   meclizine  (ANTIVERT ) tablet 25 mg   DISCONTD: lisinopril  (ZESTRIL ) tablet 20 mg   meclizine  (ANTIVERT ) 25 MG tablet    Sig: Take 1 tablet (25 mg total) by mouth 3 (three) times daily as needed for dizziness.    Dispense:  30 tablet    Refill:  0    -I have reviewed the patients home medicines and have made adjustments as needed  Critical interventions none  Cardiac Monitoring: The patient was maintained on a cardiac monitor.  I personally viewed and interpreted the cardiac monitored which showed an underlying rhythm of: NSR  Social Determinants of Health:  Factors impacting patients care include: none   Reevaluation: After the interventions noted above, I reevaluated the patient and found that they have :improved  Co morbidities that complicate the patient evaluation  Past Medical History:  Diagnosis Date   Diabetes mellitus without complication (HCC)    Hyperlipidemia    Hypertension       Dispostion: I considered admission for this patient, but at this time she does not meet inpatient  criteria for admission and will be discharged with outpatient follow-up with return precautions     Final Clinical Impression(s) / ED Diagnoses Final diagnoses:  Vertigo     @PCDICTATION @    Albertina Dixon, MD 02/22/23 1343

## 2023-03-03 ENCOUNTER — Encounter: Payer: Self-pay | Admitting: Physical Therapy

## 2023-03-03 ENCOUNTER — Ambulatory Visit: Payer: BC Managed Care – PPO | Attending: Family Medicine | Admitting: Physical Therapy

## 2023-03-03 ENCOUNTER — Other Ambulatory Visit: Payer: Self-pay

## 2023-03-03 VITALS — BP 133/95 | HR 88

## 2023-03-03 DIAGNOSIS — R2681 Unsteadiness on feet: Secondary | ICD-10-CM | POA: Diagnosis not present

## 2023-03-03 DIAGNOSIS — R42 Dizziness and giddiness: Secondary | ICD-10-CM | POA: Diagnosis not present

## 2023-03-03 NOTE — Therapy (Unsigned)
OUTPATIENT PHYSICAL THERAPY VESTIBULAR EVALUATION   Patient Name: Diane Sherman MRN: [id number] DOB:07-22-1955, 68 y.o., female Today's Date: 03/04/2023  END OF SESSION:  PT End of Session - 03/03/23 1614     Visit Number 1    Number of Visits 7    Date for PT Re-Evaluation 04/08/23    Authorization Type [employer]    PT Start Time [phone]    PT Stop Time 1650    PT Time Calculation (min) 37 min    Equipment Utilized During Treatment Other (comment)   mat level   Activity Tolerance Patient tolerated treatment well;Treatment limited secondary to medical complications (Comment)   BP elevated which limited some testing   Behavior During Therapy [facility] for tasks assessed/performed;Flat affect             Past Medical History:  Diagnosis Date   Diabetes mellitus without complication (HCC)    Hyperlipidemia    Hypertension    Past Surgical History:  Procedure Laterality Date   BREAST SURGERY     breast reduction   CESAREAN SECTION     REDUCTION MAMMAPLASTY     There are no active problems to display for this patient.   PCP: Laurann Montana, MD REFERRING PROVIDER: Laurann Montana, MD  REFERRING DIAG: R42 (ICD-10-CM) - Vertigo  THERAPY DIAG:  Dizziness and giddiness - Plan: PT plan of care cert/re-cert  Unsteadiness on feet - Plan: PT plan of care cert/re-cert  ONSET DATE: 03/02/2023 (referral date)   Rationale for Evaluation and Treatment: Rehabilitation  SUBJECTIVE:   SUBJECTIVE STATEMENT: Patient reports that she has "vertical vertigo." Patient reports Friday two weeks ago, she looked down to make sure she wouldn't hit anything as she felt like she was going to pass out but was able to catch hereself; she reports that it turns out she had vertigo. She had a sudden onset of feeling like the room was spinning that lasted into the next few days. She reports that her symptoms continued for several days. She went to ED and stroke and other red flags were  ruled out. Patient reports that she was told she had an inner ear infection. Patient was told by her PCP that she had BPPV later when her symptoms persisted with certain movements. Patient has taken meclizine 3x daily and reports feeling significantly improved since then. Patient reports that she is still taking meclizine. Patient was given Epley maneuver for home but states she thinks it's for "horizontal vertigo" and seems to have made her symptoms worse. She now only gets dizzy looking down and sometimes when she gets up and doesn't feel like she has to hold onto many things. She reports that she did have covid right before new years leading up to her symptoms. Patient denies hearing changes with onset of symptoms. Patient reports some dizziness looking over her right shoulder in the shower.   Pt accompanied by: self and drove self   PERTINENT HISTORY: hypertension, DM II on insulin   PAIN:  Are you having pain? Yes - shoulder pain being addressed by outpatient ortho care  PRECAUTIONS: Fall and ICD/Pacemaker  RED FLAGS: None   WEIGHT BEARING RESTRICTIONS: No  FALLS: Has patient fallen in last 6 months?  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  DIAGNOSTIC FINDINGS: All imaging normal in hospital per patient   COGNITION: Overall cognitive status: Within functional limits for tasks assessed   Cervical ROM:    Active A/PROM (deg) eval  Flexion WFL  Extension WFL  Right lateral flexion WFL  Left lateral flexion WFL  Right rotation WFL   Left rotation WFL  (Blank rows = not tested)  PATIENT SURVEYS:  FOTO Not captured on eval  VESTIBULAR ASSESSMENT:  Note: Testing may be impacted as patient is taking meclizine   GENERAL OBSERVATION: wears glasses for reading   SYMPTOM BEHAVIOR:  Subjective history: see above  Non-Vestibular symptoms: neck pain and nausea/vomiting Type of dizziness: Imbalance (Disequilibrium), Spinning/Vertigo, Unsteady with head/body turns, "Funny feeling in the head", and "World moves"  Frequency: any time I try to look down  Duration: "stops as soon as I move my head"   Aggravating factors: looking down  Relieving factors: getting out of the position   Progression of symptoms: better  OCULOMOTOR EXAM:  Ocular Alignment: normal  Ocular ROM: No Limitations  Spontaneous Nystagmus: absent  Gaze-Induced Nystagmus: absent  Smooth Pursuits: intact  Saccades:  mild decrease in speed appropriate for age  Convergence/Divergence: < 5 cm    VESTIBULAR - OCULAR REFLEX:   Slow VOR: Normal  VOR Cancellation: Normal  Head-Impulse Test: HIT Right: negative HIT Left: negative  Dynamic Visual Acuity: Not tested on eval   POSITIONAL TESTING: Not tested on eval as BP elevated; see below for readings Vitals:   03/03/23 1622  BP: (!) 133/95  Pulse: 88     MOTION SENSITIVITY:  Motion Sensitivity Quotient Intensity: 0 = none, 1 = Lightheaded, 2 = Mild, 3 = Moderate, 4 = Severe, 5 = Vomiting  Intensity  1. Sitting to supine   2. Supine to L side   3. Supine to R side   4. Supine to sitting   5. L Hallpike-Dix   6. Up from L    7. R Hallpike-Dix   8. Up from R    9. Sitting, head tipped to L knee 2  10. Head up from L knee 2  11. Sitting, head tipped to R knee 3  12. Head up from R knee 3  13. Sitting head turns x5 1  14.Sitting head nods x5 1  15. In stance, 180 turn to L  0  16. In stance, 180 turn to R 0                                                                                                                          TREATMENT:   Initial Eval only  PATIENT EDUCATION: Education details: POC, goal collaboration, examination findings, and BP safety, discussed meclizine and timing for future session and  recommend follow up with PCP if have specific medication questions  Person educated: Patient Education method: Explanation Education comprehension: verbalized understanding and needs further education  HOME EXERCISE PROGRAM:  To be provided   GOALS: Goals reviewed with patient? Yes  LONG TERM GOALS: Target date: 04/08/2023 (STG = LTG due to POC length)   Patient will report demonstrate independence with final HEP in order to maintain current gains and continue to progress after physical therapy discharge.   Baseline: To be provided  Goal status: INITIAL  2.  Patient will tolerate positional maneuver testing without indications of a positive test to indicate resolution of suspected BPPV in order for patient to return to activities of daily living.   Baseline: To be assessed  Goal status: INITIAL  3.  MSQ to be completed and LTG written  Baseline: 3/5 worse with head tipped over R knee  Goal status: INITIAL  4.  If positional testing clear, SOT to be assessed and LTG written  Baseline:  Goal status: INITIAL  CLINICAL IMPRESSION: Patient is a 68 y.o. female who was seen today for physical therapy evaluation and treatment for vertigo. Patient with sudden onset of symptoms ~2 weeks ago and symptoms have greatly improved since taking meclizine. Patient reports greatest residual dizziness now occurs getting up in the morning, looking down quickly, or turning over shoulder in shower. Testing on eval limited as patient taking meclizine regularly as well as BP elevated and held positional testing in today's session. Discussed safe BP readings and discussed plan to evaluate further in future sessions. HIT testing clear in today's session but patient found to have moderate  levels of motion sensitivity on MSQ. Plan to prioritize positional testing next session as patient subjective and ongoing symptom report has some indication that this should not be ruled out and recommend re-asssessing HIT when not on meclizine to rule out hypofunction. Patient will benefit from skilled physical therapy services to address underlying deficits, progress towards LTGs, and discharge.   OBJECTIVE IMPAIRMENTS: decreased balance and dizziness.   ACTIVITY LIMITATIONS: bending, reach over head, and locomotion level  PARTICIPATION LIMITATIONS: cleaning, laundry, community activity, and occupation  PERSONAL FACTORS: Age and 1-2 comorbidities: see above  are also affecting patient's functional outcome.   REHAB POTENTIAL: Good  CLINICAL DECISION MAKING: Stable/uncomplicated  EVALUATION COMPLEXITY: Low   PLAN:  PT FREQUENCY: 2x/week  PT DURATION: 3 weeks  PLANNED INTERVENTIONS: 97164- PT Re-evaluation, 97110-Therapeutic exercises, 97530- Therapeutic activity, 97112- Neuromuscular re-education, 97535- Self Care, 86578- Manual therapy, [phone]- Gait training, and [phone]- Canalith repositioning  PLAN FOR NEXT SESSION: monitor BP, re-test vestibular testing as needed as patient should not be on meclizine next session, assess positional maneuvers (reports dizizness looking over L shoulder in shower and greatest reports on dizziness both on L and R on MSQ thus far), finish MSQ and write goal, consider habituation for residual motions sensitivity if no BPPV found, patient aware that early D/C possible pending on examination findings    If positional testing clear, assess SOT and write LTG as indicated    Carmelia Bake, PT, DPT 03/04/2023, 8:34 AM

## 2023-03-04 ENCOUNTER — Encounter: Payer: Self-pay | Admitting: Physical Therapy

## 2023-03-05 ENCOUNTER — Ambulatory Visit: Payer: BC Managed Care – PPO

## 2023-03-05 VITALS — BP 138/95 | HR 88

## 2023-03-05 DIAGNOSIS — R42 Dizziness and giddiness: Secondary | ICD-10-CM | POA: Diagnosis not present

## 2023-03-05 DIAGNOSIS — R2681 Unsteadiness on feet: Secondary | ICD-10-CM

## 2023-03-05 NOTE — Therapy (Signed)
OUTPATIENT PHYSICAL THERAPY VESTIBULAR EVALUATION   Patient Name: Diane Sherman MRN: [id number] DOB:01-30-1956, 68 y.o., female Today's Date: 03/05/2023  END OF SESSION:  PT End of Session - 03/05/23 1150     Visit Number 2    Number of Visits 7    Date for PT Re-Evaluation 04/08/23    Authorization Type [employer] [place]    PT Start Time 1149   patient late   PT Stop Time 1228    PT Time Calculation (min) 39 min    Activity Tolerance Patient tolerated treatment well    Behavior During Therapy WFL for tasks assessed/performed             Past Medical History:  Diagnosis Date   Diabetes mellitus without complication (HCC)    Hyperlipidemia    Hypertension    Past Surgical History:  Procedure Laterality Date   BREAST SURGERY     breast reduction   CESAREAN SECTION     REDUCTION MAMMAPLASTY     There are no active problems to display for this patient.   PCP: Laurann Montana, MD REFERRING PROVIDER: Laurann Montana, MD  REFERRING DIAG: R42 (ICD-10-CM) - Vertigo  THERAPY DIAG:  Dizziness and giddiness  Unsteadiness on feet  ONSET DATE: 03/02/2023 (referral date)   Rationale for Evaluation and Treatment: Rehabilitation  SUBJECTIVE:   SUBJECTIVE STATEMENT: Patient reports doing fair. Has not taken the meclizine in last 24 hours and so is a little dizzier. 1/5. Denies falls.   Pt accompanied by: self and drove self   PERTINENT HISTORY: hypertension, DM II on insulin   PAIN:  Are you having pain? Yes - shoulder pain being addressed by outpatient ortho care  PRECAUTIONS: Fall and ICD/Pacemaker  PATIENT GOALS: "Get rid of this vertigo."   OBJECTIVE:  Note: Objective measures were completed at Evaluation unless otherwise noted.  DIAGNOSTIC FINDINGS: All imaging normal in hospital per patient    VESTIBULAR ASSESSMENT:  Note: Testing may be impacted as patient is taking meclizine   POSITIONAL TESTING: Not tested on eval as BP elevated; see  below for readings Vitals:   03/05/23 1155 03/05/23 1206  BP: (!) 142/100 (!) 138/95  Pulse: 86 88   R roll test: (-)  L roll test: (-) R DH: (-)  L DH: (-)                                                                                                                      TREATMENT:  -seated VOR horizontal/ vertical   -increased symptoms vertical with increased difficulty maintaining eye gaze  -seated bending down/coming up, progressed to standing bending to knees and coming up  -no increase in dizziness -standing picking up cones from floor and up to table   -increased dizziness with placing cones at waist level vs increased height of table   PATIENT EDUCATION: Education details: initial HEP Person educated: Patient Education method: Explanation Education comprehension: verbalized understanding and needs further education  HOME EXERCISE  PROGRAM:  Bending / Picking Up Objects    Sitting, slowly bend head down and pick up object on the floor. Return to upright position. Hold position until symptoms subside. Repeat ___4_ times per session. Do ___3_ sessions per day.  Head Motion: Up and Down    Sitting, slowly move head up with eyes open. Hold position until symptoms subside. Then, move head in opposite direction. Hold position until symptoms subside. Repeat __10__ times per session. Do __3__ sessions per day. GOALS: Goals reviewed with patient? Yes  LONG TERM GOALS: Target date: 04/08/2023 (STG = LTG due to POC length)   Patient will report demonstrate independence with final HEP in order to maintain current gains and continue to progress after physical therapy discharge.   Baseline: To be provided  Goal status: INITIAL  2.  Patient will tolerate positional maneuver testing without indications of a positive test to indicate resolution of suspected BPPV in order for patient to return to activities of daily living.   Baseline: To be assessed  Goal status: INITIAL  3.   MSQ to be completed and LTG written  Baseline: 3/5 worse with head tipped over R knee  Goal status: INITIAL  4.  If positional testing clear, SOT to be assessed and LTG written  Baseline:  Goal status: INITIAL  CLINICAL IMPRESSION: Patient seen for skilled PT session with emphasis on completing positional testing and initiating vestibular retraining. No nystagmus observed with positional testing- however, patient did report mild dizziness with R roll test and R DH and so may warrant re-test at next appt. Tolerated habituation exercises well with mild dizziness reported more so with bending down to the ground and coming back up. Continue POC.   OBJECTIVE IMPAIRMENTS: decreased balance and dizziness.   ACTIVITY LIMITATIONS: bending, reach over head, and locomotion level  PARTICIPATION LIMITATIONS: cleaning, laundry, community activity, and occupation  PERSONAL FACTORS: Age and 1-2 comorbidities: see above  are also affecting patient's functional outcome.   REHAB POTENTIAL: Good  CLINICAL DECISION MAKING: Stable/uncomplicated  EVALUATION COMPLEXITY: Low   PLAN:  PT FREQUENCY: 2x/week  PT DURATION: 3 weeks  PLANNED INTERVENTIONS: 97164- PT Re-evaluation, 97110-Therapeutic exercises, 97530- Therapeutic activity, 97112- Neuromuscular re-education, 97535- Self Care, 40981- Manual therapy, 97116- Gait training, and [phone]- Canalith repositioning  PLAN FOR NEXT SESSION: monitor BP, finish MSQ and write goal, consider habituation for residual motions sensitivity if no BPPV found, patient aware that early D/C possible pending on examination findings    If positional testing clear, assess SOT and write LTG as indicated    Westley Foots, PT Westley Foots, PT, DPT, CBIS  03/05/2023, 12:50 PM

## 2023-03-08 ENCOUNTER — Ambulatory Visit: Payer: BC Managed Care – PPO

## 2023-03-08 VITALS — BP 153/79 | HR 87

## 2023-03-08 DIAGNOSIS — R2681 Unsteadiness on feet: Secondary | ICD-10-CM | POA: Diagnosis not present

## 2023-03-08 DIAGNOSIS — R42 Dizziness and giddiness: Secondary | ICD-10-CM | POA: Diagnosis not present

## 2023-03-08 NOTE — Therapy (Signed)
OUTPATIENT PHYSICAL THERAPY VESTIBULAR EVALUATION   Patient Name: Diane Sherman MRN: [id number] DOB:[birth year], 68 y.o., female Today's Date: 03/08/2023  END OF SESSION:  PT End of Session - 03/08/23 1106     Visit Number 3    Number of Visits 7    Date for PT Re-Evaluation 04/08/23    Authorization Type [employer]    PT Start Time 1104   patient late   PT Stop Time 1142    PT Time Calculation (min) 38 min    Activity Tolerance Patient tolerated treatment well    Behavior During Therapy WFL for tasks assessed/performed             Past Medical History:  Diagnosis Date   Diabetes mellitus without complication (HCC)    Hyperlipidemia    Hypertension    Past Surgical History:  Procedure Laterality Date   BREAST SURGERY     breast reduction   CESAREAN SECTION     REDUCTION MAMMAPLASTY     There are no active problems to display for this patient.   PCP: Laurann Montana, MD REFERRING PROVIDER: Laurann Montana, MD  REFERRING DIAG: R42 (ICD-10-CM) - Vertigo  THERAPY DIAG:  Dizziness and giddiness  Unsteadiness on feet  ONSET DATE: 03/02/2023 (referral date)   Rationale for Evaluation and Treatment: Rehabilitation  SUBJECTIVE:   SUBJECTIVE STATEMENT: Patient reports doing well. Has not taken meclizine since last appt. Did take gabapentin last night and so is groggy. Denies falls.   Pt accompanied by: self and drove self   PERTINENT HISTORY: hypertension, DM II on insulin   PAIN:  Are you having pain? Yes - shoulder pain being addressed by outpatient ortho care  PRECAUTIONS: Fall and ICD/Pacemaker  PATIENT GOALS: "Get rid of this vertigo."   OBJECTIVE:  Note: Objective measures were completed at Evaluation unless otherwise noted.  DIAGNOSTIC FINDINGS: All imaging normal in hospital per patient    VESTIBULAR ASSESSMENT:  POSITIONAL TESTING:  Vitals:   03/08/23 1112  BP: (!) 153/79  Pulse: 87   R roll test: (-)  L roll test:  (-) R DH: (-)  L DH: (-)                                                                                                                      TREATMENT:  -standing bending down coming up placing cones on table + trunk twist   -progressed to use of checkered rug  -progressed to airex + rug  -standing on solid ground scarf toss-> standing on Airex  -rockerboard A/P EO, EC, head turns -ant/post step over rockerboard A/P   PATIENT EDUCATION: Education details: continue HEP Person educated: Patient Education method: Explanation Education comprehension: verbalized understanding and needs further education  HOME EXERCISE PROGRAM:  Bending / Picking Up Objects    Sitting, slowly bend head down and pick up object on the floor. Return to upright position. Hold position until symptoms subside. Repeat ___4_ times per session. Do ___3_  sessions per day.  Head Motion: Up and Down    Sitting, slowly move head up with eyes open. Hold position until symptoms subside. Then, move head in opposite direction. Hold position until symptoms subside. Repeat __10__ times per session. Do __3__ sessions per day. GOALS: Goals reviewed with patient? Yes  LONG TERM GOALS: Target date: 04/08/2023 (STG = LTG due to POC length)   Patient will report demonstrate independence with final HEP in order to maintain current gains and continue to progress after physical therapy discharge.   Baseline: To be provided  Goal status: INITIAL  2.  Patient will tolerate positional maneuver testing without indications of a positive test to indicate resolution of suspected BPPV in order for patient to return to activities of daily living.   Baseline: To be assessed  Goal status: INITIAL  3.  MSQ to be completed and LTG written  Baseline: 3/5 worse with head tipped over R knee  Goal status: INITIAL  4.  If positional testing clear, SOT to be assessed and LTG written  Baseline:  Goal status: INITIAL  CLINICAL  IMPRESSION: Patient seen for skilled PT session with emphasis on vestibular retraining. Positional testing continues to remain clear and no evidence of nystagmus. Progressed habituation of bending down and coming up to use of patterned rug and Airex with noted increase in general postural sway, but no LOB. Appropriate use of ankle strategy on rockerboard and use of vestibular system with no overt LOB. Continue POC.   OBJECTIVE IMPAIRMENTS: decreased balance and dizziness.   ACTIVITY LIMITATIONS: bending, reach over head, and locomotion level  PARTICIPATION LIMITATIONS: cleaning, laundry, community activity, and occupation  PERSONAL FACTORS: Age and 1-2 comorbidities: see above  are also affecting patient's functional outcome.   REHAB POTENTIAL: Good  CLINICAL DECISION MAKING: Stable/uncomplicated  EVALUATION COMPLEXITY: Low   PLAN:  PT FREQUENCY: 2x/week  PT DURATION: 3 weeks  PLANNED INTERVENTIONS: 97164- PT Re-evaluation, 97110-Therapeutic exercises, 97530- Therapeutic activity, 97112- Neuromuscular re-education, 97535- Self Care, 16109- Manual therapy, 97116- Gait training, and [phone]- Canalith repositioning  PLAN FOR NEXT SESSION: monitor BP, finish MSQ and write goal, consider habituation for residual motions sensitivity if no BPPV found, patient aware that early D/C possible pending on examination findings    If positional testing clear, assess SOT and write LTG as indicated    Westley Foots, PT Westley Foots, PT, DPT, CBIS  03/08/2023, 11:58 AM

## 2023-03-09 DIAGNOSIS — D2262 Melanocytic nevi of left upper limb, including shoulder: Secondary | ICD-10-CM | POA: Diagnosis not present

## 2023-03-09 DIAGNOSIS — Z85828 Personal history of other malignant neoplasm of skin: Secondary | ICD-10-CM | POA: Diagnosis not present

## 2023-03-09 DIAGNOSIS — L738 Other specified follicular disorders: Secondary | ICD-10-CM | POA: Diagnosis not present

## 2023-03-09 DIAGNOSIS — D2261 Melanocytic nevi of right upper limb, including shoulder: Secondary | ICD-10-CM | POA: Diagnosis not present

## 2023-03-12 ENCOUNTER — Ambulatory Visit: Payer: BC Managed Care – PPO

## 2023-03-12 VITALS — BP 153/83 | HR 89

## 2023-03-12 DIAGNOSIS — R2681 Unsteadiness on feet: Secondary | ICD-10-CM

## 2023-03-12 DIAGNOSIS — R42 Dizziness and giddiness: Secondary | ICD-10-CM

## 2023-03-12 NOTE — Therapy (Signed)
OUTPATIENT PHYSICAL THERAPY VESTIBULAR EVALUATION   Patient Name: Diane Sherman MRN: [id number] DOB:[birth year], 68 y.o., female Today's Date: 03/12/2023  END OF SESSION:  PT End of Session - 03/12/23 1312     Visit Number 4    Number of Visits 7    Date for PT Re-Evaluation 04/08/23    Authorization Type [employer]    PT Start Time 1315    PT Stop Time 1353    PT Time Calculation (min) 38 min    Activity Tolerance Patient tolerated treatment well    Behavior During Therapy WFL for tasks assessed/performed             Past Medical History:  Diagnosis Date   Diabetes mellitus without complication (HCC)    Hyperlipidemia    Hypertension    Past Surgical History:  Procedure Laterality Date   BREAST SURGERY     breast reduction   CESAREAN SECTION     REDUCTION MAMMAPLASTY     There are no active problems to display for this patient.   PCP: Laurann Montana, MD REFERRING PROVIDER: Laurann Montana, MD  REFERRING DIAG: R42 (ICD-10-CM) - Vertigo  THERAPY DIAG:  Dizziness and giddiness  Unsteadiness on feet  ONSET DATE: 03/02/2023 (referral date)   Rationale for Evaluation and Treatment: Rehabilitation  SUBJECTIVE:   SUBJECTIVE STATEMENT: Patient reports doing well. She continues to get mild dizziness with repetitive bending down, though this is improving. Requesting to pause PT to see how much she will be billed. Denies falls.   Pt accompanied by: self and drove self   PERTINENT HISTORY: hypertension, DM II on insulin   PAIN:  Are you having pain? Yes - shoulder pain being addressed by outpatient ortho care  PRECAUTIONS: Fall and ICD/Pacemaker  PATIENT GOALS: "Get rid of this vertigo."   OBJECTIVE:  Note: Objective measures were completed at Evaluation unless otherwise noted.  DIAGNOSTIC FINDINGS: All imaging normal in hospital per patient    VESTIBULAR ASSESSMENT:  POSITIONAL TESTING:  Vitals:   03/12/23 1326  BP: (!) 153/83   Pulse: 89                                                                                                  TREATMENT:  -addressed PT POC -added to HEP (see below) to address balance   PATIENT EDUCATION: Education details: continue HEP, PT POC Person educated: Patient Education method: Explanation Education comprehension: verbalized understanding and needs further education  HOME EXERCISE PROGRAM:  Bending / Picking Up Objects    Sitting, slowly bend head down and pick up object on the floor. Return to upright position. Hold position until symptoms subside. Repeat ___4_ times per session. Do ___3_ sessions per day.  Head Motion: Up and Down    Sitting, slowly move head up with eyes open. Hold position until symptoms subside. Then, move head in opposite direction. Hold position until symptoms subside. Repeat __10__ times per session. Do __3__ sessions per day.  Access Code: [email or web address] URL: [email or web address] Date: 03/12/2023 Prepared by: Merry Lofty  Exercises - Standing Balance in Corner with  Eyes Closed  - 1 x daily - 7 x weekly - 3 sets - 30s hold - Corner Balance Feet Apart: Eyes Open With Head Turns  - 1 x daily - 7 x weekly - 3 sets - 30s hold - Corner Balance Feet Together With Eyes Open  - 1 x daily - 7 x weekly - 3 sets - 30s hold - Corner Balance Feet Together With Eyes Closed  - 1 x daily - 7 x weekly - 3 sets - 30s hold - Semi-Tandem Corner Balance With Eyes Open  - 1 x daily - 7 x weekly - 3 sets - 30s hold - Corner Balance Feet Together With Eyes Closed  - 1 x daily - 7 x weekly - 3 sets - 30s hold - Corner Balance Feet Together: Eyes Closed With Head Turns  - 1 x daily - 7 x weekly - 3 sets - 30s hold GOALS: Goals reviewed with patient? Yes  LONG TERM GOALS: Target date: 04/08/2023 (STG = LTG due to POC length)   Patient will report demonstrate independence with final HEP in order to maintain current gains and continue to progress after  physical therapy discharge.   Baseline: To be provided  Goal status: INITIAL  2.  Patient will tolerate positional maneuver testing without indications of a positive test to indicate resolution of suspected BPPV in order for patient to return to activities of daily living.   Baseline: To be assessed  Goal status: INITIAL  3.  MSQ to be completed and LTG written  Baseline: 3/5 worse with head tipped over R knee  Goal status: INITIAL  4.  If positional testing clear, SOT to be assessed and LTG written  Baseline:  Goal status: INITIAL  CLINICAL IMPRESSION: Patient seen for skilled PT session with emphasis on adding to HEP to address balance concerns. LOB biasing R noted, but appropriate balance strategies initiated. Increased LOB on compliant surface with EC suggesting vestibular involvement, however, all vestibular testing normal at this point (positional, HIT, VOR, etc). Discussed that if she does not return within 30 days we will dc from PT/need a new referral to resume. Patient verbalized understanding. Continue POC PRN.    OBJECTIVE IMPAIRMENTS: decreased balance and dizziness.   ACTIVITY LIMITATIONS: bending, reach over head, and locomotion level  PARTICIPATION LIMITATIONS: cleaning, laundry, community activity, and occupation  PERSONAL FACTORS: Age and 1-2 comorbidities: see above  are also affecting patient's functional outcome.   REHAB POTENTIAL: Good  CLINICAL DECISION MAKING: Stable/uncomplicated  EVALUATION COMPLEXITY: Low   PLAN:  PT FREQUENCY: 2x/week  PT DURATION: 3 weeks  PLANNED INTERVENTIONS: 97164- PT Re-evaluation, 97110-Therapeutic exercises, 97530- Therapeutic activity, [id number]- Neuromuscular re-education, 97535- Self Care, [id number]- Manual therapy, [id number]- Gait training, and [phone]- Canalith repositioning  PLAN FOR NEXT SESSION: re-cert?   Westley Foots, PT Westley Foots, PT, DPT, CBIS  03/12/2023, 1:57 PM

## 2023-03-15 ENCOUNTER — Ambulatory Visit: Payer: BC Managed Care – PPO | Admitting: Physical Therapy

## 2023-03-17 ENCOUNTER — Ambulatory Visit: Payer: BC Managed Care – PPO | Admitting: Physical Therapy

## 2023-04-27 DIAGNOSIS — E119 Type 2 diabetes mellitus without complications: Secondary | ICD-10-CM | POA: Diagnosis not present

## 2023-05-04 DIAGNOSIS — I10 Essential (primary) hypertension: Secondary | ICD-10-CM | POA: Diagnosis not present

## 2023-05-04 DIAGNOSIS — E785 Hyperlipidemia, unspecified: Secondary | ICD-10-CM | POA: Diagnosis not present

## 2023-05-04 DIAGNOSIS — E1140 Type 2 diabetes mellitus with diabetic neuropathy, unspecified: Secondary | ICD-10-CM | POA: Diagnosis not present

## 2023-05-04 DIAGNOSIS — E1165 Type 2 diabetes mellitus with hyperglycemia: Secondary | ICD-10-CM | POA: Diagnosis not present

## 2023-06-02 ENCOUNTER — Ambulatory Visit: Admission: EM | Admit: 2023-06-02 | Discharge: 2023-06-02 | Disposition: A | Discharge status: Home / Self Care

## 2023-06-02 ENCOUNTER — Other Ambulatory Visit: Payer: Self-pay

## 2023-06-02 DIAGNOSIS — H1013 Acute atopic conjunctivitis, bilateral: Secondary | ICD-10-CM | POA: Diagnosis not present

## 2023-06-02 NOTE — ED Triage Notes (Signed)
 Pt presents with complaints of eye drainage x weeks + eye irritation x 2 days. Pt states the eye drainage is white. Left side is worse than right side. Pt is also reporting runny nose + stuffiness as well. Pt currently denies pain. OTC moisture eye drops applied with no improvement. Flonase taken as needed + Allegra.

## 2023-06-02 NOTE — Discharge Instructions (Addendum)
 At this time I suspect that your eye symptoms are likely secondary to your allergies.  You can use an antihistamine eyedrop to help with this.  I typically recommend ketotifen eyedrops or olopatadine eyedrops.  These are available over-the-counter and usually help with allergic symptoms that cause dry eye, eye drainage and irritation.  You can use lubricating eyedrops such as blink tears, refresh eyedrops or the Systane drops that you have been using.  I also recommend sterile eye flushes to help remove pollen or allergens from the eyes.  If you start to develop fever, chills, pressure sensation around the eyes, vision changes, severe headaches I recommend following up with your PCP or go to the emergency room for more severe symptoms

## 2023-06-02 NOTE — ED Provider Notes (Signed)
 [facility]    CSN: [id number] Arrival date & time: 06/02/23  1478      History   Chief Complaint Chief Complaint  Patient presents with   Eye Drainage    HPI Tehilla Coffel Landeck is a 68 y.o. female.   HPI   She reports she is having eye irritation and drainage  She reports her symptoms started in the left eye 2-3 days ago and now she is having similar symptoms in the right eye too She denies eye pain but reports that they are irritated  She denies foreign body sensation but states they feel dry  She denies matting in the AM   Interventions: Systain moisture drops   She reports a hx of seasonal allergies and is taking Allegra daily and Flonase PRN    Past Medical History:  Diagnosis Date   Diabetes mellitus without complication (HCC)    Hyperlipidemia    Hypertension     There are no active problems to display for this patient.   Past Surgical History:  Procedure Laterality Date   BREAST SURGERY     breast reduction   CESAREAN SECTION     REDUCTION MAMMAPLASTY      OB History   No obstetric history on file.      Home Medications    Prior to Admission medications   Medication Sig Start Date End Date Taking? Authorizing Provider  aspirin EC 81 MG tablet Take 81 mg by mouth daily.    [provider]  Dulaglutide (TRULICITY) 1.5 MG/0.5ML SOPN Inject into the skin. Patient not taking: Reported on 03/03/2023    [provider]  empagliflozin (JARDIANCE) 25 MG TABS tablet Take 25 mg by mouth daily.    [provider]  fexofenadine (ALLEGRA) 180 MG tablet Take 180 mg by mouth daily.    [provider]  fluticasone (FLONASE) 50 MCG/ACT nasal spray Place into both nostrils daily.    [provider]  gabapentin (NEURONTIN) 100 MG capsule Take 100 mg by mouth 3 (three) times daily.    [provider]  HYDROcodone -acetaminophen  (NORCO/VICODIN) 5-325 MG tablet Take 1 tablet by mouth every 6  (six) hours as needed for moderate pain. Patient not taking: Reported on 03/03/2023 10/04/16   Mirta Ammon, PA-C  insulin glargine (LANTUS) 100 UNIT/ML injection Inject 16 Units into the skin daily.    [provider]  lisinopril  (PRINIVIL ,ZESTRIL ) 20 MG tablet Take 20 mg by mouth daily.    [provider]  meclizine  (ANTIVERT ) 25 MG tablet Take 1 tablet (25 mg total) by mouth 3 (three) times daily as needed for dizziness. 02/21/23   Kommor, Madison, MD  metFORMIN (GLUCOPHAGE) 500 MG tablet Take 1,000 mg by mouth 2 (two) times daily with a meal.    [provider]  omeprazole (PRILOSEC) 40 MG capsule Take 40 mg by mouth daily.    [provider]  Semaglutide, 1 MG/DOSE, (OZEMPIC, 1 MG/DOSE,) 2 MG/1.5ML SOPN Inject 1 mg into the skin.    [provider]  simvastatin (ZOCOR) 20 MG tablet Take 20 mg by mouth daily.    [provider]    Family History History reviewed. No pertinent family history.  Social History Social History   Tobacco Use   Smoking status: Never   Smokeless tobacco: Never  Vaping Use   Vaping status: Never Used  Substance Use Topics   Alcohol use: No   Drug use: No     Allergies  Patient has no known allergies.   Review of Systems Review of Systems  Constitutional:  Negative for chills and fever.  HENT:  Positive for congestion and rhinorrhea.   Eyes:  Positive for discharge, redness and itching. Negative for photophobia, pain and visual disturbance.     Physical Exam Triage Vital Signs ED Triage Vitals  Encounter Vitals Group     BP 06/02/23 1005 (!) 148/90     Systolic BP Percentile --      Diastolic BP Percentile --      Pulse Rate 06/02/23 1005 89     Resp 06/02/23 1005 17     Temp 06/02/23 1005 97.7 F (36.5 C)     Temp Source 06/02/23 1005 Oral     SpO2 06/02/23 1005 94 %     Weight 06/02/23 1005 240 lb (108.9 kg)     Height 06/02/23 1005 5\' 5"  (1.651 m)     Head Circumference --       Peak Flow --      Pain Score 06/02/23 1018 0     Pain Loc --      Pain Education --      Exclude from Growth Chart --    No data found.  Updated Vital Signs BP (!) 148/90 (BP Location: Right Arm)   Pulse 89   Temp 97.7 F (36.5 C) (Oral)   Resp 17   Ht 5\' 5"  (1.651 m)   Wt 240 lb (108.9 kg)   SpO2 94%   BMI 39.94 kg/m   Visual Acuity Right Eye Distance:   Left Eye Distance:   Bilateral Distance:    Right Eye Near:   Left Eye Near:    Bilateral Near:     Physical Exam Vitals reviewed.  Constitutional:      General: She is awake.     Appearance: Normal appearance. She is well-developed and well-groomed.  HENT:     Head: Normocephalic and atraumatic.  Eyes:     General: Lids are normal. Gaze aligned appropriately.        Right eye: No foreign body, discharge or hordeolum.        Left eye: No foreign body, discharge or hordeolum.     Extraocular Movements: Extraocular movements intact.     Right eye: Normal extraocular motion and no nystagmus.     Left eye: Normal extraocular motion and no nystagmus.     Conjunctiva/sclera: Conjunctivae normal.     Pupils: Pupils are equal, round, and reactive to light.  Pulmonary:     Effort: Pulmonary effort is normal.  Musculoskeletal:     Cervical back: Normal range of motion and neck supple.  Neurological:     General: No focal deficit present.     Mental Status: She is alert and oriented to person, place, and time.     GCS: GCS eye subscore is 4. GCS verbal subscore is 5. GCS motor subscore is 6.     Cranial Nerves: No cranial nerve deficit, dysarthria or facial asymmetry.  Psychiatric:        Attention and Perception: Attention and perception normal.        Mood and Affect: Mood and affect normal.        Speech: Speech normal.        Behavior: Behavior normal. Behavior is cooperative.      UC Treatments / Results  Labs (all labs ordered are listed, but only abnormal results are displayed) Labs Reviewed - No  data to display  EKG   Radiology No results found.  Procedures Procedures (including critical care time)  Medications Ordered in UC Medications - No data to display  Initial Impression / Assessment and Plan / UC Course  I have reviewed the triage vital signs and the nursing notes.  Pertinent labs & imaging results that were available during my care of the patient were reviewed by me and considered in my medical decision making (see chart for details).      Final Clinical Impressions(s) / UC Diagnoses   Final diagnoses:  Allergic conjunctivitis of both eyes   Patient presents today with concerns for eye drainage and eye irritation for the past 2 days.  She also reports mild runny nose and nasal congestion but denies ocular pain, vision changes, eye matting or photophobia.  Physical exam is overall benign.  At this time suspect allergic conjunctivitis bilaterally.  Recommend antihistamine eyedrops, lubricating eyedrops and continuing with second-generation antihistamine.  Reviewed that she should use Flonase daily to further assist with rhinorrhea and nasal congestion.  ED and return precautions reviewed and provided after visit summary.  Follow-up as needed.    Discharge Instructions      At this time I suspect that your eye symptoms are likely secondary to your allergies.  You can use an antihistamine eyedrop to help with this.  I typically recommend ketotifen eyedrops or olopatadine eyedrops.  These are available over-the-counter and usually help with allergic symptoms that cause dry eye, eye drainage and irritation.  You can use lubricating eyedrops such as blink tears, refresh eyedrops or the Systane drops that you have been using.  I also recommend sterile eye flushes to help remove pollen or allergens from the eyes.  If you start to develop fever, chills, pressure sensation around the eyes, vision changes, severe headaches I recommend following up with your PCP or go to the  emergency room for more severe symptoms     ED Prescriptions   None    PDMP not reviewed this encounter.   Woodford Hayward 06/02/23 2006

## 2023-06-13 DIAGNOSIS — J019 Acute sinusitis, unspecified: Secondary | ICD-10-CM | POA: Diagnosis not present

## 2023-07-13 DIAGNOSIS — Z0000 Encounter for general adult medical examination without abnormal findings: Secondary | ICD-10-CM | POA: Diagnosis not present

## 2023-07-13 DIAGNOSIS — I10 Essential (primary) hypertension: Secondary | ICD-10-CM | POA: Diagnosis not present

## 2023-07-13 DIAGNOSIS — M8588 Other specified disorders of bone density and structure, other site: Secondary | ICD-10-CM | POA: Diagnosis not present

## 2023-07-13 DIAGNOSIS — E785 Hyperlipidemia, unspecified: Secondary | ICD-10-CM | POA: Diagnosis not present

## 2023-07-13 DIAGNOSIS — E1169 Type 2 diabetes mellitus with other specified complication: Secondary | ICD-10-CM | POA: Diagnosis not present

## 2023-07-14 ENCOUNTER — Other Ambulatory Visit: Payer: Self-pay | Admitting: Family Medicine

## 2023-07-14 DIAGNOSIS — M7989 Other specified soft tissue disorders: Secondary | ICD-10-CM

## 2023-08-02 ENCOUNTER — Encounter: Payer: Self-pay | Admitting: Family Medicine

## 2023-08-03 ENCOUNTER — Ambulatory Visit
Admission: RE | Admit: 2023-08-03 | Discharge: 2023-08-03 | Disposition: A | Discharge status: Home / Self Care | Source: Ambulatory Visit | Attending: Family Medicine | Admitting: Family Medicine

## 2023-08-03 DIAGNOSIS — K760 Fatty (change of) liver, not elsewhere classified: Secondary | ICD-10-CM | POA: Diagnosis not present

## 2023-08-03 DIAGNOSIS — M7989 Other specified soft tissue disorders: Secondary | ICD-10-CM

## 2023-08-03 DIAGNOSIS — R162 Hepatomegaly with splenomegaly, not elsewhere classified: Secondary | ICD-10-CM | POA: Diagnosis not present

## 2023-08-03 DIAGNOSIS — K8020 Calculus of gallbladder without cholecystitis without obstruction: Secondary | ICD-10-CM | POA: Diagnosis not present

## 2023-08-03 MED ORDER — GADOPICLENOL 0.5 MMOL/ML IV SOLN
10.0000 mL | Freq: Once | INTRAVENOUS | Status: AC | PRN
  Administered 2023-08-03: 10 mL via INTRAVENOUS

## 2023-08-04 DIAGNOSIS — M25512 Pain in left shoulder: Secondary | ICD-10-CM | POA: Diagnosis not present

## 2023-08-18 DIAGNOSIS — E1165 Type 2 diabetes mellitus with hyperglycemia: Secondary | ICD-10-CM | POA: Diagnosis not present

## 2023-08-25 DIAGNOSIS — E119 Type 2 diabetes mellitus without complications: Secondary | ICD-10-CM | POA: Diagnosis not present

## 2023-08-25 DIAGNOSIS — I10 Essential (primary) hypertension: Secondary | ICD-10-CM | POA: Diagnosis not present

## 2023-08-25 DIAGNOSIS — E785 Hyperlipidemia, unspecified: Secondary | ICD-10-CM | POA: Diagnosis not present

## 2023-08-25 DIAGNOSIS — E1165 Type 2 diabetes mellitus with hyperglycemia: Secondary | ICD-10-CM | POA: Diagnosis not present

## 2023-08-27 DIAGNOSIS — J019 Acute sinusitis, unspecified: Secondary | ICD-10-CM | POA: Diagnosis not present

## 2023-08-27 DIAGNOSIS — M545 Low back pain, unspecified: Secondary | ICD-10-CM | POA: Diagnosis not present

## 2023-09-02 DIAGNOSIS — M545 Low back pain, unspecified: Secondary | ICD-10-CM | POA: Diagnosis not present

## 2023-09-13 DIAGNOSIS — M4807 Spinal stenosis, lumbosacral region: Secondary | ICD-10-CM | POA: Diagnosis not present

## 2023-09-13 DIAGNOSIS — M5137 Other intervertebral disc degeneration, lumbosacral region with discogenic back pain only: Secondary | ICD-10-CM | POA: Diagnosis not present

## 2023-09-13 DIAGNOSIS — M545 Low back pain, unspecified: Secondary | ICD-10-CM | POA: Diagnosis not present

## 2023-09-27 ENCOUNTER — Other Ambulatory Visit: Payer: Self-pay | Admitting: Family Medicine

## 2023-09-27 DIAGNOSIS — Z1231 Encounter for screening mammogram for malignant neoplasm of breast: Secondary | ICD-10-CM

## 2023-10-14 ENCOUNTER — Ambulatory Visit

## 2023-10-22 ENCOUNTER — Ambulatory Visit
Admission: RE | Admit: 2023-10-22 | Discharge: 2023-10-22 | Disposition: A | Discharge status: Home / Self Care | Source: Ambulatory Visit | Attending: Family Medicine | Admitting: Family Medicine

## 2023-10-22 DIAGNOSIS — Z1231 Encounter for screening mammogram for malignant neoplasm of breast: Secondary | ICD-10-CM | POA: Diagnosis not present

## 2023-11-12 DIAGNOSIS — E113291 Type 2 diabetes mellitus with mild nonproliferative diabetic retinopathy without macular edema, right eye: Secondary | ICD-10-CM | POA: Diagnosis not present

## 2023-11-12 DIAGNOSIS — H5203 Hypermetropia, bilateral: Secondary | ICD-10-CM | POA: Diagnosis not present

## 2023-11-12 DIAGNOSIS — H524 Presbyopia: Secondary | ICD-10-CM | POA: Diagnosis not present

## 2023-11-17 ENCOUNTER — Encounter (HOSPITAL_BASED_OUTPATIENT_CLINIC_OR_DEPARTMENT_OTHER): Payer: Self-pay | Admitting: Physical Therapy

## 2023-11-17 ENCOUNTER — Other Ambulatory Visit: Payer: Self-pay

## 2023-11-17 ENCOUNTER — Ambulatory Visit (HOSPITAL_BASED_OUTPATIENT_CLINIC_OR_DEPARTMENT_OTHER): Attending: Family Medicine | Admitting: Physical Therapy

## 2023-11-17 DIAGNOSIS — M6281 Muscle weakness (generalized): Secondary | ICD-10-CM | POA: Diagnosis not present

## 2023-11-17 DIAGNOSIS — M5459 Other low back pain: Secondary | ICD-10-CM | POA: Insufficient documentation

## 2023-11-17 DIAGNOSIS — R293 Abnormal posture: Secondary | ICD-10-CM | POA: Insufficient documentation

## 2023-11-17 NOTE — Therapy (Signed)
 OUTPATIENT PHYSICAL THERAPY THORACOLUMBAR EVALUATION   Patient Name: Diane Sherman MRN: [id number] DOB:1955-07-16, 68 y.o., female Today's Date: 11/17/2023  END OF SESSION:  PT End of Session - 11/17/23 1408     Visit Number 1    Number of Visits 13    Date for Recertification  12/29/23    Authorization Type [employer] [place]    Authorization Time Period 11/17/23 to 12/29/23    PT Start Time 1346    PT Stop Time 1424    PT Time Calculation (min) 38 min    Activity Tolerance Patient tolerated treatment well    Behavior During Therapy WFL for tasks assessed/performed          Past Medical History:  Diagnosis Date   Diabetes mellitus without complication (HCC)    Hyperlipidemia    Hypertension    Past Surgical History:  Procedure Laterality Date   BREAST SURGERY     breast reduction   CESAREAN SECTION     REDUCTION MAMMAPLASTY  2006   There are no active problems to display for this patient.   PCP: Teresa Channel, MD  REFERRING PROVIDER: Teresa Channel, MD  REFERRING DIAG: M54.50 (ICD-10-CM) - Low back pain, unspecified  Rationale for Evaluation and Treatment: Rehabilitation  THERAPY DIAG:  Other low back pain  Muscle weakness (generalized)  Abnormal posture  ONSET DATE: early August 2025  SUBJECTIVE:                                                                                                                                                                                           SUBJECTIVE STATEMENT:  They did an xray on my back, started having really bad pain right before I was supposed to go on vacation. Xray determined that I have OA in my low back. Back is good as long as I don't have to stand for a long time and do something. 20-30 minutes is about the limit. I always thought my back pain was from the epidural from when my son was born. I can't pound walk but I can do walking videos.   PERTINENT HISTORY:   See above   PAIN:  Are you having  pain? No 0/10 now, couple weeks where we went to the beach it got pretty bad   PRECAUTIONS: None  RED FLAGS: None   WEIGHT BEARING RESTRICTIONS: No  FALLS:  Has patient fallen in last 6 months? 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    PATIENT SURVEYS:  PSFS: THE PATIENT SPECIFIC FUNCTIONAL SCALE  Place score of 0-10 (0 = unable to perform activity and 10 = able to perform activity at the same level as before injury or problem)  Activity Date: 11/17/23 Eval      Standing a long time  3    2.     3.     4.      Total Score 3      Total Score = Sum of activity scores/number of activities  Minimally Detectable Change: 3 points (for single activity); 2 points (for average score)  [facility] (nd). The Patient Specific Functional Scale . Retrieved from [email or web address]   COGNITION: Overall cognitive status: Within functional limits for tasks assessed     SENSATION: Hx of neuropathy from DM2   MUSCLE LENGTH:  HS WNL B Piriformis severe limitation R, mod limitation L   POSTURE: rounded shoulders and forward head    LUMBAR ROM:   AROM eval  Flexion WNL   Extension WNL   Right lateral flexion WNL   Left lateral flexion WNL   Right rotation   Left rotation    (Blank rows = not tested)    LOWER EXTREMITY MMT:    MMT Right eval Left eval  Hip flexion 3+ 3+  Hip extension    Hip abduction 4 4  Hip adduction    Hip internal  rotation    Hip external rotation    Knee flexion 4 4  Knee extension 4+ 4+  Ankle dorsiflexion    Ankle plantarflexion    Ankle inversion    Ankle eversion     (Blank rows = not tested)    TREATMENT DATE:   11/17/23   Eval, POC, HEP   Nustep level 4-5 x8 minutes, seat 9 all four extremities   HEP practice and discussion                                                                                                                                    PATIENT EDUCATION:  Education details: exam findings, POC, HEP Person educated: Patient Education method: Explanation, Demonstration, and Handouts Education comprehension: verbalized understanding, returned demonstration, and needs further education  HOME EXERCISE PROGRAM:  Access Code: [email or web address] URL: [email or web address] Date: 11/17/2023 Prepared by: Josette Rough  Exercises - Supine Bridge with Resistance Band  - 1 x daily - 5 x weekly - 2 sets - 10 reps - Clamshell with Resistance  - 1 x daily - 5 x weekly - 2 sets - 10 reps - Supine Posterior Pelvic Tilt  - 1 x  daily - 5 x weekly - 2 sets - 10 reps - Supine Lower Trunk Rotation  - 2 x daily - 7 x weekly - 1 sets - 10 reps - 3 seconds  hold - Supine Single Knee to Chest Stretch  - 2 x daily - 7 x weekly - 1 sets - 10 reps - 3 seconds  hold  ASSESSMENT:  CLINICAL IMPRESSION: Patient is a 68 y.o. F who was seen today for physical therapy evaluation and treatment for M54.50 (ICD-10-CM) - Low back pain, unspecified. Objectives as above. Will make every effort to address pain and function moving forward.   OBJECTIVE IMPAIRMENTS: decreased activity tolerance, decreased mobility, difficulty walking, decreased strength, increased fascial restrictions, increased muscle spasms, impaired flexibility, improper body mechanics, postural dysfunction, obesity, and pain.   ACTIVITY LIMITATIONS: carrying, lifting, standing, stairs, transfers, and locomotion  level  PARTICIPATION LIMITATIONS: meal prep, cleaning, laundry, driving, shopping, community activity, and yard work  PERSONAL FACTORS: Age, Behavior pattern, Education, Fitness, Past/current experiences, Profession, and Time since onset of injury/illness/exacerbation are also affecting patient's functional outcome.   REHAB POTENTIAL: Fair body habitus, chronicity of pain, sedentary lifestyle, work and personal schedule as well as potential insurance change to [facility] limiting sessions   CLINICAL DECISION MAKING: Stable/uncomplicated  EVALUATION COMPLEXITY: Low   GOALS: Goals reviewed with patient? No  SHORT TERM GOALS: Target date: 12/08/2023    Will be compliant with appropriate progressive HEP  Baseline: Goal status: INITIAL  2.  Will demonstrate improved awareness of functional posture with all tasks  Baseline:  Goal status: INITIAL    LONG TERM GOALS: Target date: 12/29/2023    MMT to have improved by one grade in all weak groups  Baseline:  Goal status: INITIAL  2.  Will be able to stand for at least 45 minutes without increased pain in her back  Baseline:  Goal status: INITIAL  3.  Will be able to perform home care and hobbies as desired without increased pain in her back  Baseline:  Goal status: INITIAL  4.  PSFS to have improved by 2 points  Baseline:  Goal status: INITIAL    PLAN:  PT FREQUENCY: 1-2x/week  PT DURATION: 6 weeks  PLANNED INTERVENTIONS: 97750- Physical Performance Testing, 97110-Therapeutic exercises, 97530- Therapeutic activity, [id number]- Neuromuscular re-education, 97535- Self Care, [id number]- Manual therapy, [id number]- Aquatic Therapy, and 20560 (1-2 muscles), 20561 (3+ muscles)- Dry Needling.  PLAN FOR NEXT SESSION: general conditioning, core and hip strength, postural strength and retraining   Josette Rough, PT, DPT 11/17/23 2:25 PM

## 2023-11-22 ENCOUNTER — Encounter (HOSPITAL_BASED_OUTPATIENT_CLINIC_OR_DEPARTMENT_OTHER): Payer: Self-pay | Admitting: Physical Therapy

## 2023-11-24 ENCOUNTER — Ambulatory Visit (HOSPITAL_BASED_OUTPATIENT_CLINIC_OR_DEPARTMENT_OTHER): Admitting: Physical Therapy

## 2023-11-24 ENCOUNTER — Encounter (HOSPITAL_BASED_OUTPATIENT_CLINIC_OR_DEPARTMENT_OTHER): Payer: Self-pay | Admitting: Physical Therapy

## 2023-11-24 DIAGNOSIS — M5459 Other low back pain: Secondary | ICD-10-CM | POA: Diagnosis not present

## 2023-11-24 DIAGNOSIS — M6281 Muscle weakness (generalized): Secondary | ICD-10-CM | POA: Diagnosis not present

## 2023-11-24 DIAGNOSIS — R293 Abnormal posture: Secondary | ICD-10-CM

## 2023-11-24 NOTE — Therapy (Signed)
 OUTPATIENT PHYSICAL THERAPY THORACOLUMBAR TREATMENT    Patient Name: Diane Sherman MRN: [id number] DOB:[birth year], 68 y.o., female Today's Date: 11/24/2023  END OF SESSION:  PT End of Session - 11/24/23 1131     Visit Number 2    Number of Visits 13    Date for Recertification  12/29/23    Authorization Type [employer] [place]    Authorization Time Period 11/17/23 to 12/29/23    PT Start Time 1102    PT Stop Time 1140    PT Time Calculation (min) 38 min    Activity Tolerance Patient tolerated treatment well    Behavior During Therapy WFL for tasks assessed/performed           Past Medical History:  Diagnosis Date   Diabetes mellitus without complication (HCC)    Hyperlipidemia    Hypertension    Past Surgical History:  Procedure Laterality Date   BREAST SURGERY     breast reduction   CESAREAN SECTION     REDUCTION MAMMAPLASTY  2006   There are no active problems to display for this patient.   PCP: Teresa Channel, MD  REFERRING PROVIDER: Teresa Channel, MD  REFERRING DIAG: M54.50 (ICD-10-CM) - Low back pain, unspecified  Rationale for Evaluation and Treatment: Rehabilitation  THERAPY DIAG:  Other low back pain  Muscle weakness (generalized)  Abnormal posture  ONSET DATE: early August 2025  SUBJECTIVE:                                                                                                                                                                                           SUBJECTIVE STATEMENT:   Having pain from HEP, its very hard for me to get up from the floor, tried doing them on the couch but got some bad mm spasms      EVAL: They did an xray on my back, started having really bad pain right before I was supposed to go on vacation. Xray determined that I have OA in my low back. Back is good as long as I don't have to stand for a long time and do something. 20-30 minutes is about the limit. I always thought my back pain was from the  epidural from when my son was born. I can't pound walk but I can do walking videos.   PERTINENT HISTORY:   See above   PAIN:  Are you having pain? No 0/10 now, worst over past week when standing for a long time 7-8/10  PRECAUTIONS: None  RED FLAGS: None   WEIGHT BEARING RESTRICTIONS: No  FALLS:  Has patient fallen in last 6 months?  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    PATIENT SURVEYS:  PSFS: THE PATIENT SPECIFIC FUNCTIONAL SCALE  Place score of 0-10 (0 = unable to perform activity and 10 = able to perform activity at the same level as before injury or problem)  Activity Date: 11/17/23 Eval      Standing a long time  3    2.     3.     4.      Total Score 3      Total Score = Sum of activity scores/number of activities  Minimally Detectable Change: 3 points (for single activity); 2 points (for average score)  [facility] (nd). The Patient Specific Functional Scale . Retrieved from [email or web address]   COGNITION: Overall cognitive status: Within functional limits for tasks assessed     SENSATION: Hx of neuropathy from DM2   MUSCLE LENGTH:  HS WNL B Piriformis severe limitation R, mod limitation L   POSTURE: rounded shoulders and forward head    LUMBAR ROM:   AROM eval  Flexion WNL   Extension WNL   Right lateral flexion WNL   Left lateral flexion WNL   Right rotation   Left rotation    (Blank rows = not  tested)    LOWER EXTREMITY MMT:    MMT Right eval Left eval  Hip flexion 3+ 3+  Hip extension    Hip abduction 4 4  Hip adduction    Hip internal rotation    Hip external rotation    Knee flexion 4 4  Knee extension 4+ 4+  Ankle dorsiflexion    Ankle plantarflexion    Ankle inversion    Ankle eversion     (Blank rows = not tested)    TREATMENT DATE:    11/24/23  Nustep L5x8 minutes seat 9 all four extremities   Figure 4 piriformis stretch 2x30 seconds B  Seated clams yellow TB 2x15 Seated marches yellow TB 2x20 STS + ABD into yellow TB x10 Standing hip ABD into yellow TB 2x12 B L stretch at counter 3x30 seconds  Hip hikes x12 B TA set + march x15 B Lumbar stretch seated 3x30 seconds  Lumbar rotation stretches alternating 10x5 seconds seated     11/17/23   Eval, POC, HEP   Nustep level 4-5 x8 minutes, seat 9 all four extremities   HEP practice and discussion  PATIENT EDUCATION:  Education details: exam findings, POC, HEP Person educated: Patient Education method: Explanation, Demonstration, and Handouts Education comprehension: verbalized understanding, returned demonstration, and needs further education  HOME EXERCISE PROGRAM:  Access Code: [email or web address] URL: [email or web address] Date: 11/24/2023 Prepared by: Josette Rough  Exercises - Sit to Stand with Resistance Around Legs  - 1 x daily - 7 x weekly - 2 sets - 10 reps - Standing Hip Abduction with Resistance at Thighs  - 1 x daily - 7 x weekly - 2 sets - 10 reps - Seated Hip Abduction with Resistance  - 1 x daily - 7 x weekly - 2 sets - 10 reps - Standing 'L' Stretch at Counter  - 1 x daily - 7 x weekly - 2 sets - 4 reps - 30 seconds  hold - Seated Figure 4 Piriformis Stretch  - 1 x daily - 7 x weekly - 2 sets - 4 reps - 30 seconds   hold  ASSESSMENT:  CLINICAL IMPRESSION:  Arrives today with c/o from original HEP- has been doing them on the floor and was having a hard time getting back up. Revised HEP a bit but did encourage her to try original set from the bed instead, as this should be more manageable than getting all the way down to floor. Otherwise progressed exercises as able and tolerated. Will continue efforts.    EVAL: Patient is a 68 y.o. F who was seen today for physical therapy evaluation and treatment for M54.50 (ICD-10-CM) - Low back pain, unspecified. Objectives as above. Will make every effort to address pain and function moving forward.   OBJECTIVE IMPAIRMENTS: decreased activity tolerance, decreased mobility, difficulty walking, decreased strength, increased fascial restrictions, increased muscle spasms, impaired flexibility, improper body mechanics, postural dysfunction, obesity, and pain.   ACTIVITY LIMITATIONS: carrying, lifting, standing, stairs, transfers, and locomotion level  PARTICIPATION LIMITATIONS: meal prep, cleaning, laundry, driving, shopping, community activity, and yard work  PERSONAL FACTORS: Age, Behavior pattern, Education, Fitness, Past/current experiences, Profession, and Time since onset of injury/illness/exacerbation are also affecting patient's functional outcome.   REHAB POTENTIAL: Fair body habitus, chronicity of pain, sedentary lifestyle, work and personal schedule as well as potential insurance change to [facility] limiting sessions   CLINICAL DECISION MAKING: Stable/uncomplicated  EVALUATION COMPLEXITY: Low   GOALS: Goals reviewed with patient? No  SHORT TERM GOALS: Target date: 12/08/2023    Will be compliant with appropriate progressive HEP  Baseline: Goal status: INITIAL  2.  Will demonstrate improved awareness of functional posture with all tasks  Baseline:  Goal status: INITIAL    LONG TERM GOALS: Target date: 12/29/2023    MMT to have improved by one grade  in all weak groups  Baseline:  Goal status: INITIAL  2.  Will be able to stand for at least 45 minutes without increased pain in her back  Baseline:  Goal status: INITIAL  3.  Will be able to perform home care and hobbies as desired without increased pain in her back  Baseline:  Goal status: INITIAL  4.  PSFS to have improved by 2 points  Baseline:  Goal status: INITIAL    PLAN:  PT FREQUENCY: 1-2x/week  PT DURATION: 6 weeks  PLANNED INTERVENTIONS: 97750- Physical Performance Testing, 97110-Therapeutic exercises, 97530- Therapeutic activity, [id number]- Neuromuscular re-education, 97535- Self Care, 02859- Manual therapy, [id number]- Aquatic Therapy, and 20560 (1-2 muscles), 20561 (3+ muscles)- Dry Needling.  PLAN FOR NEXT SESSION: general conditioning, core and hip strength, postural strength and retraining, how is  new HEP   Josette Rough, PT, DPT 11/24/23 11:42 AM

## 2023-11-30 ENCOUNTER — Encounter (HOSPITAL_BASED_OUTPATIENT_CLINIC_OR_DEPARTMENT_OTHER): Payer: Self-pay | Admitting: Physical Therapy

## 2023-11-30 ENCOUNTER — Ambulatory Visit (HOSPITAL_BASED_OUTPATIENT_CLINIC_OR_DEPARTMENT_OTHER): Payer: Self-pay | Admitting: Physical Therapy

## 2023-11-30 DIAGNOSIS — M5459 Other low back pain: Secondary | ICD-10-CM

## 2023-11-30 DIAGNOSIS — R293 Abnormal posture: Secondary | ICD-10-CM

## 2023-11-30 DIAGNOSIS — M6281 Muscle weakness (generalized): Secondary | ICD-10-CM | POA: Diagnosis not present

## 2023-11-30 NOTE — Therapy (Signed)
 OUTPATIENT PHYSICAL THERAPY THORACOLUMBAR TREATMENT    Patient Name: Diane Sherman MRN: [id number] DOB:1955-10-13, 68 y.o., female Today's Date: 11/30/2023  END OF SESSION:  PT End of Session - 11/30/23 1226     Visit Number 3    Number of Visits 13    Date for Recertification  12/29/23    Authorization Type [employer] [place]    Authorization Time Period 11/17/23 to 12/29/23    PT Start Time 1148    PT Stop Time 1226    PT Time Calculation (min) 38 min    Activity Tolerance Patient tolerated treatment well    Behavior During Therapy WFL for tasks assessed/performed            Past Medical History:  Diagnosis Date   Diabetes mellitus without complication (HCC)    Hyperlipidemia    Hypertension    Past Surgical History:  Procedure Laterality Date   BREAST SURGERY     breast reduction   CESAREAN SECTION     REDUCTION MAMMAPLASTY  2006   There are no active problems to display for this patient.   PCP: Teresa Channel, MD  REFERRING PROVIDER: Teresa Channel, MD  REFERRING DIAG: M54.50 (ICD-10-CM) - Low back pain, unspecified  Rationale for Evaluation and Treatment: Rehabilitation  THERAPY DIAG:  Other low back pain  Muscle weakness (generalized)  Abnormal posture  ONSET DATE: early August 2025  SUBJECTIVE:                                                                                                                                                                                           SUBJECTIVE STATEMENT:   Feeling OK, little sore from doing a lot of cleaning getting ready for some company. Second set of exercises worked better for me than that first set but didn't get to do them much at home du eto having guests      EVAL: They did an xray on my back, started having really bad pain right before I was supposed to go on vacation. Xray determined that I have OA in my low back. Back is good as long as I don't have to stand for a long time and do  something. 20-30 minutes is about the limit. I always thought my back pain was from the epidural from when my son was born. I can't pound walk but I can do walking videos.   PERTINENT HISTORY:   See above   PAIN:  Are you having pain? Yes: NPRS scale: 2/10 Pain location: low back  Pain description: sore, aching  Aggravating factors: standing  Relieving factors: sitting  PRECAUTIONS: None  RED FLAGS: None   WEIGHT BEARING RESTRICTIONS: No  FALLS:  Has patient fallen in last 6 months? 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    PATIENT SURVEYS:  PSFS: THE PATIENT SPECIFIC FUNCTIONAL SCALE  Place score of 0-10 (0 = unable to perform activity and 10 = able to perform activity at the same level as before injury or problem)  Activity Date: 11/17/23 Eval      Standing a long time  3    2.     3.     4.      Total Score 3      Total Score = Sum of activity scores/number of activities  Minimally Detectable Change: 3 points (for single activity); 2 points (for average score)  [facility] (nd). The Patient Specific Functional Scale . Retrieved from [email or web address]   COGNITION: Overall cognitive status: Within functional limits for tasks assessed     SENSATION: Hx of neuropathy from DM2   MUSCLE LENGTH:  HS WNL B Piriformis severe limitation R, mod limitation L   POSTURE: rounded shoulders and forward head    LUMBAR ROM:   AROM eval   Flexion WNL   Extension WNL   Right lateral flexion WNL   Left lateral flexion WNL   Right rotation   Left rotation    (Blank rows = not tested)    LOWER EXTREMITY MMT:    MMT Right eval Left eval  Hip flexion 3+ 3+  Hip extension    Hip abduction 4 4  Hip adduction    Hip internal rotation    Hip external rotation    Knee flexion 4 4  Knee extension 4+ 4+  Ankle dorsiflexion    Ankle plantarflexion    Ankle inversion    Ankle eversion     (Blank rows = not tested)    TREATMENT DATE:    11/30/23  Nustep L5x8 minutes seat 9 all four extremies  Figure 4 piriformis stretch 2x30 seconds B  Seated HS stretch 2x30 seconds B QL stretch 3 way x30 seconds each  Seated clams yellow TB x15  Seated marches yellow TB alternating x20 STS yellow TB x12  Standing hip ABD yellow TB x12 B Standing hip extension yellow TB x12 B Standing march yellow TB x12 B Standing hip hikes x15 B Standing hip hike + swing x10 B Lumbar flexion stretch 5x5 seconds at table  Scapular retractions red TB 10x2 seconds  Shoulder extensions red TB 10x2 seconds    11/24/23  Nustep L5x8 minutes seat 9 all four extremities   Figure 4 piriformis stretch 2x30 seconds B  Seated clams yellow TB 2x15 Seated marches yellow TB 2x20 STS + ABD into yellow TB x10 Standing hip ABD into yellow TB 2x12 B L stretch at counter 3x30 seconds  Hip hikes x12 B TA set + march x15 B Lumbar stretch seated 3x30 seconds  Lumbar rotation stretches alternating 10x5 seconds seated     11/17/23   Eval, POC, HEP  Nustep level 4-5 x8 minutes, seat 9 all four extremities   HEP practice and discussion                                                                                                                                    PATIENT EDUCATION:  Education details: exam findings, POC, HEP Person educated: Patient Education method: Explanation, Demonstration, and Handouts Education comprehension:  verbalized understanding, returned demonstration, and needs further education  HOME EXERCISE PROGRAM:  Access Code: [email or web address] URL: [email or web address] Date: 11/24/2023 Prepared by: Josette Rough  Exercises - Sit to Stand with Resistance Around Legs  - 1 x daily - 7 x weekly - 2 sets - 10 reps - Standing Hip Abduction with Resistance at Thighs  - 1 x daily - 7 x weekly - 2 sets - 10 reps - Seated Hip Abduction with Resistance  - 1 x daily - 7 x weekly - 2 sets - 10 reps - Standing 'L' Stretch at Counter  - 1 x daily - 7 x weekly - 2 sets - 4 reps - 30 seconds  hold - Seated Figure 4 Piriformis Stretch  - 1 x daily - 7 x weekly - 2 sets - 4 reps - 30 seconds  hold  ASSESSMENT:  CLINICAL IMPRESSION:  Arrives today doing OK, second set of HEP felt a lot better than the first. Progressed all exercises as tolerated and able this morning, held off on HEP updates for now. Introduced a little more postural training today to tolerance, will progress this next visit if she wasn't too sore. Pain 0/10 at EOS.   EVAL: Patient is a 68 y.o. F who was seen today for physical therapy evaluation and treatment for M54.50 (ICD-10-CM) - Low back pain, unspecified. Objectives as above. Will make every effort to address pain and function moving forward.   OBJECTIVE IMPAIRMENTS: decreased activity tolerance, decreased mobility, difficulty walking, decreased strength, increased fascial restrictions, increased muscle spasms, impaired flexibility, improper body mechanics, postural dysfunction, obesity, and pain.   ACTIVITY LIMITATIONS: carrying, lifting, standing, stairs, transfers, and locomotion level  PARTICIPATION LIMITATIONS: meal prep, cleaning, laundry, driving, shopping, community activity, and yard work  PERSONAL FACTORS: Age, Behavior pattern, Education, Fitness, Past/current experiences, Profession, and Time since onset of injury/illness/exacerbation are also affecting patient's  functional outcome.   REHAB POTENTIAL: Fair body habitus, chronicity of pain, sedentary lifestyle, work and personal schedule as well as potential insurance change to [facility] limiting sessions   CLINICAL DECISION MAKING: Stable/uncomplicated  EVALUATION COMPLEXITY: Low   GOALS: Goals reviewed with patient? No  SHORT TERM GOALS: Target date: 12/08/2023    Will be compliant with appropriate progressive HEP  Baseline: Goal status: INITIAL  2.  Will demonstrate improved awareness of functional posture with all tasks  Baseline:  Goal status: INITIAL    LONG TERM GOALS: Target date: 12/29/2023    MMT to have improved by one grade in all weak  groups  Baseline:  Goal status: INITIAL  2.  Will be able to stand for at least 45 minutes without increased pain in her back  Baseline:  Goal status: INITIAL  3.  Will be able to perform home care and hobbies as desired without increased pain in her back  Baseline:  Goal status: INITIAL  4.  PSFS to have improved by 2 points  Baseline:  Goal status: INITIAL    PLAN:  PT FREQUENCY: 1-2x/week  PT DURATION: 6 weeks  PLANNED INTERVENTIONS: 97750- Physical Performance Testing, 97110-Therapeutic exercises, 97530- Therapeutic activity, [id number]- Neuromuscular re-education, 97535- Self Care, [id number]- Manual therapy, [id number]- Aquatic Therapy, and 20560 (1-2 muscles), 20561 (3+ muscles)- Dry Needling.  PLAN FOR NEXT SESSION: general conditioning, core and hip strength, postural strength and retraining, add postural TB work to [employer] next visit   Josette Rough, PT, DPT 11/30/23 12:27 PM

## 2023-12-01 ENCOUNTER — Encounter (HOSPITAL_BASED_OUTPATIENT_CLINIC_OR_DEPARTMENT_OTHER): Admitting: Physical Therapy

## 2023-12-08 ENCOUNTER — Ambulatory Visit (HOSPITAL_BASED_OUTPATIENT_CLINIC_OR_DEPARTMENT_OTHER): Admitting: Physical Therapy

## 2023-12-14 DIAGNOSIS — J329 Chronic sinusitis, unspecified: Secondary | ICD-10-CM | POA: Diagnosis not present

## 2023-12-14 DIAGNOSIS — J411 Mucopurulent chronic bronchitis: Secondary | ICD-10-CM | POA: Diagnosis not present

## 2024-01-12 DIAGNOSIS — Z794 Long term (current) use of insulin: Secondary | ICD-10-CM | POA: Diagnosis not present

## 2024-01-12 DIAGNOSIS — E785 Hyperlipidemia, unspecified: Secondary | ICD-10-CM | POA: Diagnosis not present

## 2024-01-12 DIAGNOSIS — M720 Palmar fascial fibromatosis [Dupuytren]: Secondary | ICD-10-CM | POA: Diagnosis not present

## 2024-01-12 DIAGNOSIS — E1140 Type 2 diabetes mellitus with diabetic neuropathy, unspecified: Secondary | ICD-10-CM | POA: Diagnosis not present

## 2024-01-12 DIAGNOSIS — I83891 Varicose veins of right lower extremities with other complications: Secondary | ICD-10-CM | POA: Diagnosis not present

## 2024-01-12 DIAGNOSIS — M792 Neuralgia and neuritis, unspecified: Secondary | ICD-10-CM | POA: Diagnosis not present

## 2024-01-12 DIAGNOSIS — I10 Essential (primary) hypertension: Secondary | ICD-10-CM | POA: Diagnosis not present

## 2024-01-12 DIAGNOSIS — D3502 Benign neoplasm of left adrenal gland: Secondary | ICD-10-CM | POA: Diagnosis not present
